# Patient Record
Sex: Male | Born: 1950
Health system: Southern US, Community
[De-identification: ages and names within clinical notes are randomized; demographics above are authoritative.]

## PROBLEM LIST (undated history)

## (undated) DIAGNOSIS — Z789 Other specified health status: Secondary | ICD-10-CM

## (undated) DIAGNOSIS — I609 Nontraumatic subarachnoid hemorrhage, unspecified: Secondary | ICD-10-CM

## (undated) DIAGNOSIS — J939 Pneumothorax, unspecified: Secondary | ICD-10-CM

## (undated) DIAGNOSIS — R296 Repeated falls: Secondary | ICD-10-CM

## (undated) HISTORY — DX: Nontraumatic subarachnoid hemorrhage, unspecified: I60.9

## (undated) HISTORY — DX: Pneumothorax, unspecified: J93.9

## (undated) HISTORY — PX: KNEE ARTHROSCOPY: SUR90

## (undated) HISTORY — DX: Repeated falls: R29.6

---

## 2011-08-19 DIAGNOSIS — J939 Pneumothorax, unspecified: Secondary | ICD-10-CM

## 2011-08-19 DIAGNOSIS — I609 Nontraumatic subarachnoid hemorrhage, unspecified: Secondary | ICD-10-CM

## 2011-08-19 HISTORY — DX: Nontraumatic subarachnoid hemorrhage, unspecified: I60.9

## 2011-08-19 HISTORY — DX: Pneumothorax, unspecified: J93.9

## 2011-10-21 ENCOUNTER — Emergency Department (HOSPITAL_BASED_OUTPATIENT_CLINIC_OR_DEPARTMENT_OTHER)
Admission: EM | Admit: 2011-10-21 | Discharge: 2011-10-21 | Discharge status: Against Medical Advice | Payer: BC Managed Care – PPO | Attending: Emergency Medicine | Admitting: Emergency Medicine

## 2011-10-21 ENCOUNTER — Encounter (HOSPITAL_BASED_OUTPATIENT_CLINIC_OR_DEPARTMENT_OTHER): Payer: Self-pay | Admitting: *Deleted

## 2011-10-21 DIAGNOSIS — R29810 Facial weakness: Secondary | ICD-10-CM | POA: Insufficient documentation

## 2011-10-21 DIAGNOSIS — F17200 Nicotine dependence, unspecified, uncomplicated: Secondary | ICD-10-CM | POA: Insufficient documentation

## 2011-10-21 DIAGNOSIS — W108XXA Fall (on) (from) other stairs and steps, initial encounter: Secondary | ICD-10-CM | POA: Insufficient documentation

## 2011-10-21 DIAGNOSIS — H748X9 Other specified disorders of middle ear and mastoid, unspecified ear: Secondary | ICD-10-CM | POA: Insufficient documentation

## 2011-10-21 DIAGNOSIS — Z9660 Presence of unspecified orthopedic joint implant: Secondary | ICD-10-CM | POA: Insufficient documentation

## 2011-10-21 DIAGNOSIS — S0990XA Unspecified injury of head, initial encounter: Secondary | ICD-10-CM

## 2011-10-21 DIAGNOSIS — G510 Bell's palsy: Secondary | ICD-10-CM

## 2011-10-21 DIAGNOSIS — Z880 Allergy status to penicillin: Secondary | ICD-10-CM | POA: Insufficient documentation

## 2011-10-21 DIAGNOSIS — S0450XA Injury of facial nerve, unspecified side, initial encounter: Secondary | ICD-10-CM | POA: Insufficient documentation

## 2011-10-21 NOTE — ED Provider Notes (Addendum)
History     CSN: [id number]  Arrival date & time 10/21/11  1654   First MD Initiated Contact with Patient 10/21/11 1706      Chief Complaint  Patient presents with  . Fall    (Consider location/radiation/quality/duration/timing/severity/associated sxs/prior treatment) HPI Comments: Patient states the ems came out after the fall but he refused care.  He tells me that since the injury occurred he has had decreased hearing out of his left ear, water runs out of the left side of his mouth when he drinks and his wife has told him that the left side of his face is drooping.  He denies headache or visual disturbance.  Patient is a 61 y.o. male presenting with fall. The history is provided by the patient.  Fall Incident onset: 12 days ago. The fall occurred while walking. Distance fallen: down steps and hit head on concrete floor. He landed on concrete. The volume of blood lost was moderate. The point of impact was the head. The pain is present in the head.    History reviewed. No pertinent past medical history.  Past Surgical History  Procedure Date  . Joint replacement     History reviewed. No pertinent family history.  History  Substance Use Topics  . Smoking status: Current Everyday Smoker  . Smokeless tobacco: Not on file  . Alcohol Use: Yes      Review of Systems  All other systems reviewed and are negative.    Allergies  Penicillins  Home Medications   Current Outpatient Rx  Name Route Sig Dispense Refill  . NAPROXEN SODIUM 220 MG PO TABS Oral Take 220 mg by mouth 2 (two) times daily as needed. For pain      BP 127/83  Pulse 104  Temp(Src) 98.4 F (36.9 C) (Oral)  Resp 16  Ht 5\' 8"  (1.727 m)  Wt 150 lb (68.04 kg)  BMI 22.81 kg/m2  SpO2 100%  Physical Exam  Nursing note and vitals reviewed. Constitutional: He is oriented to person, place, and time. He appears well-developed and well-nourished. No distress.  HENT:  Head: Normocephalic and atraumatic.    Right Ear: External ear normal.       The left ear canal has old, dried blood.  There also appears to be a hemotympanum on the left as well.    Eyes: EOM are normal. Pupils are equal, round, and reactive to light.       The left eye lid does not close unless he blinks forcefully.    Neck: Normal range of motion. Neck supple.  Musculoskeletal: Normal range of motion. He exhibits no edema.  Neurological: He is alert and oriented to person, place, and time. A cranial nerve deficit is present. Coordination normal.       There is a left facial droop on the left side along with an inability to completely close the eye without effort.  It appears as though the entire left 7th cranial nerve is paralyzed.    The strength is equal and symmetrical bilaterally.     Skin: Skin is warm and dry. He is not diaphoretic.    ED Course  Procedures (including critical care time)  Labs Reviewed - No data to display No results found.   No diagnosis found.    MDM  The patient presented 12 days after a significant head trauma.  He refused ems transport at that time and only comes in today because his wife made him.  He has what appears  to be a paralysis of the left facial nerve along with old blood in the ear canal.  I strongly suspect that he sustained a skull fracture that has compromised the left facial nerve, and may quite possibly have an intracranial hemorrhage.  The patient refuses to allow me to perform a CT scan due to monetary reasons.  I attempted to impress upon the importance of performing this study, however he wants to go home.  I have explained to him the risks of leaving without further studies, including disability and death.  He accepts these risks and is leaving AMA.  He tells me he will check with his family when he gets home about how to finance this test and may return once he works this out.  In my opinion, he has the decision making capacity to take responsibility for his  actions.       Geoffery Lyons, MD 10/21/11 1745  Geoffery Lyons, MD 10/21/11 [id number]

## 2011-10-21 NOTE — Discharge Instructions (Signed)
You may return to the ER at any time should you desire completion of your workup.

## 2011-10-21 NOTE — ED Notes (Signed)
Pt remains adamant that he is going home and refusing any further examination, testing, or treatment. Explained to patient it is Against Medical Advice that he leave prior to having the tests mentioned. Explained to pt that the risks of leaving ama are worsening of condition and possibility of death. Pt verbalizes understanding stating " I been living the past 12 days maybe I will live another few hours till I can make up my mind" Pt also verbalizes understanding that he is welcome to return to this ED or any other emergency department should his symptoms worsen or should he decide he wants be seen . Pt signed the AMA agreement after discussing risks with Dr Judd Lien and Antony Odea RN.

## 2011-10-21 NOTE — ED Notes (Signed)
Pt c/o fall x 12 days ago, pain to job cont

## 2011-10-21 NOTE — ED Notes (Signed)
Dr Judd Lien in room to examine pt explains to him that he needs to have a ct scan of his head due to the symptoms he has now and reportedly has had since a fall he had 12 days ago Noted to have left eye dysfunction the patient blinks but eye stays open pt also states "when I smile the left side of my face doesn't move just right and it looks sideways and crooked and sometimes if Im just sitting there water will just pour out of the left side of my mouth" Dr Judd Lien explains to pt how serious his previous injury could be and recommends a CT scan to determine the extent of his injuries . Pt states he doesn't think he wants to do that he just  Came to see why his jaw was still hurting. MD explained to this pt that he could have nerve damage that is causing his symptoms and pt states "Well I am just going to have to go home tonight and think about it and will come back tomorrow if my daddy and some others think that's a good idea" This RN in to speak with pt explaining that a CT scan will be beneficial to him to determine why his jaw is still hurting as well as to be sure he hasnt broken any bones in his face or skull. Pt tells me the same thing states " I am going to go home and think it over and talk it over with people. I will just come on back tomorrow if I change my mind"

## 2011-10-27 ENCOUNTER — Emergency Department (HOSPITAL_BASED_OUTPATIENT_CLINIC_OR_DEPARTMENT_OTHER)
Admission: EM | Admit: 2011-10-27 | Discharge: 2011-10-27 | Disposition: A | Discharge status: Home / Self Care | Payer: BC Managed Care – PPO | Attending: Emergency Medicine | Admitting: Emergency Medicine

## 2011-10-27 ENCOUNTER — Encounter (HOSPITAL_BASED_OUTPATIENT_CLINIC_OR_DEPARTMENT_OTHER): Payer: Self-pay

## 2011-10-27 ENCOUNTER — Emergency Department (INDEPENDENT_AMBULATORY_CARE_PROVIDER_SITE_OTHER): Payer: BC Managed Care – PPO

## 2011-10-27 DIAGNOSIS — W1809XA Striking against other object with subsequent fall, initial encounter: Secondary | ICD-10-CM | POA: Insufficient documentation

## 2011-10-27 DIAGNOSIS — F17200 Nicotine dependence, unspecified, uncomplicated: Secondary | ICD-10-CM | POA: Insufficient documentation

## 2011-10-27 DIAGNOSIS — S06300A Unspecified focal traumatic brain injury without loss of consciousness, initial encounter: Secondary | ICD-10-CM | POA: Insufficient documentation

## 2011-10-27 DIAGNOSIS — S02109A Fracture of base of skull, unspecified side, initial encounter for closed fracture: Secondary | ICD-10-CM | POA: Insufficient documentation

## 2011-10-27 DIAGNOSIS — H9209 Otalgia, unspecified ear: Secondary | ICD-10-CM | POA: Insufficient documentation

## 2011-10-27 DIAGNOSIS — H9190 Unspecified hearing loss, unspecified ear: Secondary | ICD-10-CM | POA: Insufficient documentation

## 2011-10-27 DIAGNOSIS — W19XXXA Unspecified fall, initial encounter: Secondary | ICD-10-CM

## 2011-10-27 DIAGNOSIS — I609 Nontraumatic subarachnoid hemorrhage, unspecified: Secondary | ICD-10-CM

## 2011-10-27 DIAGNOSIS — R29810 Facial weakness: Secondary | ICD-10-CM | POA: Insufficient documentation

## 2011-10-27 DIAGNOSIS — G510 Bell's palsy: Secondary | ICD-10-CM

## 2011-10-27 DIAGNOSIS — R51 Headache: Secondary | ICD-10-CM | POA: Insufficient documentation

## 2011-10-27 DIAGNOSIS — H7290 Unspecified perforation of tympanic membrane, unspecified ear: Secondary | ICD-10-CM

## 2011-10-27 DIAGNOSIS — S0291XA Unspecified fracture of skull, initial encounter for closed fracture: Secondary | ICD-10-CM

## 2011-10-27 MED ORDER — TEARS RENEWED OP SOLN: 1.0000 [drp] | Freq: Four times a day (QID) | OPHTHALMIC | Status: DC | PRN

## 2011-10-27 MED ORDER — LACRI-LUBE S.O.P. OP OINT: 1.0000 [in_us] | TOPICAL_OINTMENT | Freq: Four times a day (QID) | OPHTHALMIC | Status: DC | PRN

## 2011-10-27 MED ORDER — PREDNISONE 50 MG PO TABS: ORAL_TABLET | ORAL | Status: AC

## 2011-10-27 NOTE — Discharge Instructions (Signed)
Bell's Palsy  Bell's palsy is a condition in which one side of the face becomes temporarily weak or paralyzed. Most of the time no cause is found. A viral infection of the facial nerve is the most commonly suspected cause. The condition almost always clears up in a few weeks to months. However, in a small number of people, the weakness can be permanent. Sometimes steroid medicines and antiviral medicines are prescribed to improve recovery time. Blood and other tests are usually not needed, but they may be performed at your caregiver's discretion, to rule out other causes.  Careful follow up is importantto be sure the facial nerve is recovering. Because facial weakness can make it hard to blink, it is important to prevent drying of the eye. Artificial tears are often prescribed to keep the eye lubricated. Glasses or an eye patch should be worn to protect the eye, if you cannot close your eye completely. If the eye is not protected, permanent damage can be done to the cornea (clear covering over your eye). Sometimes facial massage and exercises help weakened muscles recover.   SEEK IMMEDIATE MEDICAL CARE IF:    You have increased weakness, earache, headache, or dizziness.   You develop new problems or symptoms, or the area of weakness or paralysis extends beyond the face.   You feel you are getting worse.  Document Released: 09/11/2004 Document Revised: 07/24/2011 Document Reviewed: 08/13/2009  [employer] Patient Information 2012 [employer]

## 2011-10-27 NOTE — ED Notes (Signed)
Pt states he fell in his garage 18 days ago in his garage, seen here last week, refused ct and signed out ama for financial reasons. Pt states he cont with hearing loss to left ear, pt states he had bleeding from this ear when he fell, but none since. Pt is alert and oriented, denies ha or visual changes.

## 2011-10-27 NOTE — ED Provider Notes (Signed)
This chart was scribed for Raymond Gaskins, MD by Wallis Mart. The patient was seen in room MH01/MH01 and the patient's care was started at 4:35 PM.   CSN: [id number]  Arrival date & time 10/27/11  1457   First MD Initiated Contact with Patient 10/27/11 1607      Chief Complaint  Patient presents with  . Head Injury     Patient is a 61 y.o. male presenting with head injury.  Head Injury  The incident occurred more than 1 week ago. He came to the ER via walk-in. The injury mechanism was a fall. The volume of blood lost was minimal. The quality of the pain is described as dull. The pain is mild. The pain has been improving since the injury. Pertinent negatives include no vomiting, no disorientation, no weakness and no memory loss. He has tried NSAIDs for the symptoms. The treatment provided significant relief.     Raymond Quinn is a 61 y.o. male who presents to the Emergency Department complaining of head injury. Pt states that he fell down in the garage and hit his head on concrete over 2 weeks ago. Pt wasn't sure if he fell or fainted. Daughter found him lying on the floor with blood coming out of his left ear. Pt saw Dr. Judd Lien after the fall and refused CT due to financial reasons, Dr Judd Lien noted that he may have injured 7th cranial nerve because his left eye was blinking less than the right . Per family, pt has been more tired than usual,  noted facial asymmetry following injury. Pt feels that his sx are improving but he can still only "hear 50%" out of his left ear and still experiences left ear pain, neck pain, (starting at 4 AM each night), headaches. Pt denies any current headaches, denies vision changes, difficulty swallowing or speaking.  Pt has been taking ibuprofen with relief of pain.  Pt denies any known medical problems.  Pt denies feeling faint, dizziness, lightheadedness. There are no other associated symptoms and no other alleviating or aggravating factors. Pt states that  he had a skull fracture while doing karate several years ago.   Pt admits to drinking during the day on a frequent basis  PMH - none  Past Surgical History  Procedure Date  . Knee arthroscopy     No family history on file.  History  Substance Use Topics  . Smoking status: Current Everyday Smoker    Types: Cigars  . Smokeless tobacco: Not on file  . Alcohol Use: Yes      Review of Systems  Constitutional: Negative for fever and chills.  HENT: Positive for hearing loss and ear pain. Negative for rhinorrhea and neck pain.   Eyes: Negative for pain.  Respiratory: Negative for cough and shortness of breath.   Cardiovascular: Negative for chest pain.  Gastrointestinal: Negative for nausea, vomiting, abdominal pain and diarrhea.  Genitourinary: Negative for dysuria.  Musculoskeletal: Negative for back pain.  Skin: Negative for rash.  Neurological: Negative for dizziness and weakness.  Psychiatric/Behavioral: Negative for memory loss.  All other systems reviewed and are negative.    Allergies  Penicillins  Home Medications   Current Outpatient Rx  Name Route Sig Dispense Refill  . IBUPROFEN IB PO Oral Take 1 tablet by mouth daily as needed. Patient used this medication for his ear pain.    Marland Kitchen NAPROXEN SODIUM 220 MG PO TABS Oral Take 220 mg by mouth 2 (two) times daily as needed.  For pain      BP 131/87  Pulse 96  Temp(Src) 98.3 F (36.8 C) (Oral)  Ht 5\' 9"  (1.753 m)  Wt 152 lb (68.947 kg)  BMI 22.45 kg/m2  SpO2 98%  Physical Exam CONSTITUTIONAL: Well developed/well nourished HEAD AND FACE: Normocephalic/atraumatic EYES: EOMI/PERRL ENMT: Mucous membranes moist.  Left ear - dried blood in canal, unable to visualize TM.  No tenderness/bruising at either mastoid, ears symmetric NECK: supple no meningeal signs SPINE:entire spine nontender, No bruising/crepitance/stepoffs noted to spine CV: S1/S2 noted, no murmurs/rubs/gallops noted LUNGS: Lungs are clear to  auscultation bilaterally, no apparent distress ABDOMEN: soft, nontender, no rebound or guarding GU:no cva tenderness NEURO: Awake/alert, no arm or leg drift is noted Cranial nerves 3/4/5/6//04/27/10/12 tested and intact Left facial droop noted He has weakness noted to raising left eyebrow and unable to fully close left eye No facial sensory deficits Gait normal No past pointing EXTREMITIES: pulses normal, full ROM SKIN: warm, color normal PSYCH: no abnormalities of mood noted  ED Course  Procedures  DIAGNOSTIC STUDIES: Oxygen Saturation is 98% on room air, normal by my interpretation.    COORDINATION OF CARE: 4:56 PM Pt with what appears to be bells palsy, but s/p head injury several weeks ago.  He is feeling improved, but will have some headache.  Reports his facial weakness is improved but still present.  He thinks it did not start until after his fall (facial weakness)  No other neuro deficits noted.  I did advise CT head.  5:47 PM Discussed CT findings with nsgy dr cram He reviewed images He does not feel he needs any emergent workup/admission He can f/u as outpatient with ENT He can f/u as outpatient with neurology for facial palsy He recommended steroids for his facial nerve palsy Suspect he has healed left TM rupture, advised ENT f/u and to avoid getting ear wet I discussed findings with patient/family I advised to cut back on his ETOH use We discussed eye care instructions for facial nerve palsy   MDM  Nursing notes reviewed and considered in documentation Previous records reviewed and considered    I personally performed the services described in this documentation, which was scribed in my presence. The recorded information has been reviewed and considered.      Raymond Gaskins, MD 10/27/11 1759

## 2011-10-27 NOTE — ED Notes (Signed)
Head injury 3 weeks ago-was seen in ED-refused CT due to financial reasons-left AMA-current c/o cont'd pain and decreased hearing to left ear-other c/o resolved per pt

## 2012-03-30 ENCOUNTER — Inpatient Hospital Stay (HOSPITAL_COMMUNITY)
Admission: EM | Admit: 2012-03-30 | Discharge: 2012-04-05 | DRG: 733 | Disposition: A | Discharge status: Home Health | Payer: BC Managed Care – PPO | Attending: General Surgery | Admitting: General Surgery

## 2012-03-30 ENCOUNTER — Emergency Department (HOSPITAL_COMMUNITY): Payer: BC Managed Care – PPO

## 2012-03-30 ENCOUNTER — Inpatient Hospital Stay (HOSPITAL_COMMUNITY): Payer: BC Managed Care – PPO

## 2012-03-30 ENCOUNTER — Encounter (HOSPITAL_COMMUNITY): Payer: Self-pay | Admitting: *Deleted

## 2012-03-30 DIAGNOSIS — S2242XA Multiple fractures of ribs, left side, initial encounter for closed fracture: Secondary | ICD-10-CM

## 2012-03-30 DIAGNOSIS — S272XXA Traumatic hemopneumothorax, initial encounter: Secondary | ICD-10-CM | POA: Diagnosis present

## 2012-03-30 DIAGNOSIS — S2249XA Multiple fractures of ribs, unspecified side, initial encounter for closed fracture: Secondary | ICD-10-CM | POA: Diagnosis present

## 2012-03-30 DIAGNOSIS — J9819 Other pulmonary collapse: Secondary | ICD-10-CM | POA: Diagnosis present

## 2012-03-30 DIAGNOSIS — I609 Nontraumatic subarachnoid hemorrhage, unspecified: Secondary | ICD-10-CM

## 2012-03-30 DIAGNOSIS — S069X9A Unspecified intracranial injury with loss of consciousness of unspecified duration, initial encounter: Secondary | ICD-10-CM

## 2012-03-30 DIAGNOSIS — S42009A Fracture of unspecified part of unspecified clavicle, initial encounter for closed fracture: Secondary | ICD-10-CM | POA: Diagnosis present

## 2012-03-30 DIAGNOSIS — S270XXA Traumatic pneumothorax, initial encounter: Secondary | ICD-10-CM

## 2012-03-30 DIAGNOSIS — S066X9A Traumatic subarachnoid hemorrhage with loss of consciousness of unspecified duration, initial encounter: Secondary | ICD-10-CM

## 2012-03-30 DIAGNOSIS — J939 Pneumothorax, unspecified: Secondary | ICD-10-CM

## 2012-03-30 DIAGNOSIS — S2239XA Fracture of one rib, unspecified side, initial encounter for closed fracture: Secondary | ICD-10-CM

## 2012-03-30 DIAGNOSIS — W108XXA Fall (on) (from) other stairs and steps, initial encounter: Secondary | ICD-10-CM | POA: Diagnosis present

## 2012-03-30 DIAGNOSIS — S42002A Fracture of unspecified part of left clavicle, initial encounter for closed fracture: Secondary | ICD-10-CM

## 2012-03-30 DIAGNOSIS — F17200 Nicotine dependence, unspecified, uncomplicated: Secondary | ICD-10-CM | POA: Diagnosis present

## 2012-03-30 DIAGNOSIS — H748X9 Other specified disorders of middle ear and mastoid, unspecified ear: Secondary | ICD-10-CM | POA: Diagnosis present

## 2012-03-30 DIAGNOSIS — S0219XA Other fracture of base of skull, initial encounter for closed fracture: Secondary | ICD-10-CM

## 2012-03-30 DIAGNOSIS — F1010 Alcohol abuse, uncomplicated: Secondary | ICD-10-CM

## 2012-03-30 DIAGNOSIS — S02109A Fracture of base of skull, unspecified side, initial encounter for closed fracture: Principal | ICD-10-CM | POA: Diagnosis present

## 2012-03-30 DIAGNOSIS — S065XAA Traumatic subdural hemorrhage with loss of consciousness status unknown, initial encounter: Principal | ICD-10-CM | POA: Diagnosis present

## 2012-03-30 DIAGNOSIS — Y92009 Unspecified place in unspecified non-institutional (private) residence as the place of occurrence of the external cause: Secondary | ICD-10-CM

## 2012-03-30 HISTORY — DX: Other specified health status: Z78.9

## 2012-03-30 LAB — CBC WITH DIFFERENTIAL/PLATELET
Basophils Absolute: 0.1 10*3/uL (ref 0.0–0.1)
Basophils Relative: 1 % (ref 0–1)
Eosinophils Absolute: 0.1 10*3/uL (ref 0.0–0.7)
Eosinophils Relative: 1 % (ref 0–5)
HCT: 45.7 % (ref 39.0–52.0)
Hemoglobin: 16 g/dL (ref 13.0–17.0)
Lymphocytes Relative: 30 % (ref 12–46)
Lymphs Abs: 3.7 10*3/uL (ref 0.7–4.0)
MCH: 36.6 pg — ABNORMAL HIGH (ref 26.0–34.0)
MCHC: 35 g/dL (ref 30.0–36.0)
MCV: 104.6 fL — ABNORMAL HIGH (ref 78.0–100.0)
Monocytes Absolute: 1 10*3/uL (ref 0.1–1.0)
Monocytes Relative: 8 % (ref 3–12)
Neutro Abs: 7.4 10*3/uL (ref 1.7–7.7)
Neutrophils Relative %: 60 % (ref 43–77)
Platelets: 129 10*3/uL — ABNORMAL LOW (ref 150–400)
RBC: 4.37 MIL/uL (ref 4.22–5.81)
RDW: 13.3 % (ref 11.5–15.5)
WBC: 12.3 10*3/uL — ABNORMAL HIGH (ref 4.0–10.5)

## 2012-03-30 LAB — HEPATIC FUNCTION PANEL
ALT: 20 U/L (ref 0–53)
AST: 37 U/L (ref 0–37)
Albumin: 3.6 g/dL (ref 3.5–5.2)
Alkaline Phosphatase: 50 U/L (ref 39–117)
Bilirubin, Direct: 0.2 mg/dL (ref 0.0–0.3)
Indirect Bilirubin: 0.8 mg/dL (ref 0.3–0.9)
Total Bilirubin: 1 mg/dL (ref 0.3–1.2)
Total Protein: 6.6 g/dL (ref 6.0–8.3)

## 2012-03-30 LAB — URINALYSIS, MICROSCOPIC ONLY
Bilirubin Urine: NEGATIVE
Glucose, UA: NEGATIVE mg/dL
Ketones, ur: 15 mg/dL — AB
Leukocytes, UA: NEGATIVE
Nitrite: NEGATIVE
Protein, ur: 30 mg/dL — AB
Specific Gravity, Urine: 1.024 (ref 1.005–1.030)
Urobilinogen, UA: 1 mg/dL (ref 0.0–1.0)
pH: 6.5 (ref 5.0–8.0)

## 2012-03-30 LAB — APTT: aPTT: 29 seconds (ref 24–37)

## 2012-03-30 LAB — POCT I-STAT, CHEM 8
BUN: <3 mg/dL — ABNORMAL LOW (ref 6–23)
Calcium, Ion: 1.08 mmol/L — ABNORMAL LOW (ref 1.13–1.30)
Chloride: 104 mEq/L (ref 96–112)
Creatinine, Ser: 1.1 mg/dL (ref 0.50–1.35)
Glucose, Bld: 126 mg/dL — ABNORMAL HIGH (ref 70–99)
HCT: 49 % (ref 39.0–52.0)
Hemoglobin: 16.7 g/dL (ref 13.0–17.0)
Potassium: 3.4 mEq/L — ABNORMAL LOW (ref 3.5–5.1)
Sodium: 140 mEq/L (ref 135–145)
TCO2: 22 mmol/L (ref 0–100)

## 2012-03-30 LAB — SURGICAL PCR SCREEN
MRSA, PCR: NEGATIVE
Staphylococcus aureus: NEGATIVE

## 2012-03-30 LAB — PROTIME-INR
INR: 1.03 (ref 0.00–1.49)
Prothrombin Time: 13.7 seconds (ref 11.6–15.2)

## 2012-03-30 LAB — AMMONIA: Ammonia: 27 umol/L (ref 11–60)

## 2012-03-30 LAB — ETHANOL: Alcohol, Ethyl (B): 228 mg/dL — ABNORMAL HIGH (ref 0–11)

## 2012-03-30 MED ORDER — FENTANYL CITRATE 0.05 MG/ML IJ SOLN
INTRAMUSCULAR | Status: AC
  Filled 2012-03-30: qty 2

## 2012-03-30 MED ORDER — ONDANSETRON HCL 4 MG PO TABS
4.0000 mg | ORAL_TABLET | Freq: Four times a day (QID) | ORAL | Status: DC | PRN
  Administered 2012-04-03: 4 mg via ORAL
  Filled 2012-03-30: qty 1

## 2012-03-30 MED ORDER — ONDANSETRON HCL 4 MG/2ML IJ SOLN
4.0000 mg | Freq: Four times a day (QID) | INTRAMUSCULAR | Status: DC | PRN
  Administered 2012-03-30 – 2012-03-31 (×3): 4 mg via INTRAVENOUS
  Filled 2012-03-30 (×4): qty 2

## 2012-03-30 MED ORDER — FOLIC ACID 1 MG PO TABS
1.0000 mg | ORAL_TABLET | Freq: Every day | ORAL | Status: DC
  Administered 2012-03-31 – 2012-04-05 (×6): 1 mg via ORAL
  Filled 2012-03-30 (×7): qty 1

## 2012-03-30 MED ORDER — HYDROMORPHONE HCL PF 1 MG/ML IJ SOLN
1.0000 mg | INTRAMUSCULAR | Status: DC | PRN
  Administered 2012-03-30 – 2012-03-31 (×3): 1 mg via INTRAVENOUS
  Filled 2012-03-30 (×4): qty 1

## 2012-03-30 MED ORDER — MIDAZOLAM HCL 2 MG/2ML IJ SOLN
INTRAMUSCULAR | Status: AC
  Filled 2012-03-30: qty 2

## 2012-03-30 MED ORDER — HYDROMORPHONE HCL PF 1 MG/ML IJ SOLN: 0.5000 mg | INTRAMUSCULAR | Status: DC | PRN

## 2012-03-30 MED ORDER — FENTANYL CITRATE 0.05 MG/ML IJ SOLN
50.0000 ug | Freq: Once | INTRAMUSCULAR | Status: AC
  Administered 2012-03-30: 50 ug via INTRAVENOUS

## 2012-03-30 MED ORDER — LORAZEPAM 1 MG PO TABS: 1.0000 mg | ORAL_TABLET | Freq: Four times a day (QID) | ORAL | Status: AC | PRN

## 2012-03-30 MED ORDER — SODIUM CHLORIDE 0.9 % IV BOLUS (SEPSIS)
1000.0000 mL | Freq: Once | INTRAVENOUS | Status: AC
  Administered 2012-03-30: 1000 mL via INTRAVENOUS

## 2012-03-30 MED ORDER — LORAZEPAM 2 MG/ML IJ SOLN
1.0000 mg | Freq: Four times a day (QID) | INTRAMUSCULAR | Status: AC | PRN
  Filled 2012-03-30: qty 1

## 2012-03-30 MED ORDER — THIAMINE HCL 100 MG/ML IJ SOLN
100.0000 mg | Freq: Every day | INTRAMUSCULAR | Status: DC
  Filled 2012-03-30 (×3): qty 1

## 2012-03-30 MED ORDER — LORAZEPAM 2 MG/ML IJ SOLN: 0.0000 mg | Freq: Two times a day (BID) | INTRAMUSCULAR | Status: AC

## 2012-03-30 MED ORDER — PANTOPRAZOLE SODIUM 40 MG PO TBEC
40.0000 mg | DELAYED_RELEASE_TABLET | Freq: Every day | ORAL | Status: DC
  Administered 2012-03-31: 40 mg via ORAL
  Filled 2012-03-30: qty 1

## 2012-03-30 MED ORDER — NICOTINE 21 MG/24HR TD PT24
21.0000 mg | MEDICATED_PATCH | Freq: Every day | TRANSDERMAL | Status: DC
  Administered 2012-03-31 – 2012-04-01 (×2): 21 mg via TRANSDERMAL
  Filled 2012-03-30 (×3): qty 1

## 2012-03-30 MED ORDER — FENTANYL CITRATE 0.05 MG/ML IJ SOLN
INTRAMUSCULAR | Status: AC | PRN
  Administered 2012-03-30: 50 ug via INTRAVENOUS

## 2012-03-30 MED ORDER — ADULT MULTIVITAMIN W/MINERALS CH
1.0000 | ORAL_TABLET | Freq: Every day | ORAL | Status: DC
  Administered 2012-03-31 – 2012-04-05 (×6): 1 via ORAL
  Filled 2012-03-30 (×7): qty 1

## 2012-03-30 MED ORDER — LORAZEPAM 2 MG/ML IJ SOLN
0.0000 mg | Freq: Four times a day (QID) | INTRAMUSCULAR | Status: AC
  Administered 2012-03-31: 2 mg via INTRAVENOUS
  Filled 2012-03-30: qty 1

## 2012-03-30 MED ORDER — HYDROMORPHONE HCL PF 1 MG/ML IJ SOLN
1.0000 mg | INTRAMUSCULAR | Status: DC | PRN
  Administered 2012-03-30 – 2012-03-31 (×2): 1 mg via INTRAVENOUS
  Filled 2012-03-30: qty 1

## 2012-03-30 MED ORDER — METHOCARBAMOL 100 MG/ML IJ SOLN
500.0000 mg | Freq: Four times a day (QID) | INTRAVENOUS | Status: DC | PRN
  Filled 2012-03-30: qty 5

## 2012-03-30 MED ORDER — FENTANYL CITRATE 0.05 MG/ML IJ SOLN
INTRAMUSCULAR | Status: AC
  Administered 2012-03-30: 50 ug via INTRAVENOUS
  Filled 2012-03-30: qty 2

## 2012-03-30 MED ORDER — POTASSIUM CHLORIDE IN NACL 20-0.9 MEQ/L-% IV SOLN
INTRAVENOUS | Status: DC
  Administered 2012-03-30 – 2012-04-01 (×2): via INTRAVENOUS
  Filled 2012-03-30 (×7): qty 1000

## 2012-03-30 MED ORDER — MIDAZOLAM HCL 2 MG/2ML IJ SOLN
2.0000 mg | Freq: Once | INTRAMUSCULAR | Status: AC
  Administered 2012-03-30: 16:00:00 via INTRAVENOUS

## 2012-03-30 MED ORDER — VITAMIN B-1 100 MG PO TABS
100.0000 mg | ORAL_TABLET | Freq: Every day | ORAL | Status: DC
  Administered 2012-03-31 – 2012-04-05 (×6): 100 mg via ORAL
  Filled 2012-03-30 (×7): qty 1

## 2012-03-30 MED ORDER — PANTOPRAZOLE SODIUM 40 MG IV SOLR
40.0000 mg | Freq: Every day | INTRAVENOUS | Status: DC
  Filled 2012-03-30 (×2): qty 40

## 2012-03-30 MED ORDER — IOHEXOL 300 MG/ML  SOLN
100.0000 mL | Freq: Once | INTRAMUSCULAR | Status: AC | PRN
  Administered 2012-03-30: 100 mL via INTRAVENOUS

## 2012-03-30 NOTE — ED Notes (Signed)
Reassessment completed prior to pt going to CT

## 2012-03-30 NOTE — ED Notes (Signed)
EMS-pt lives with son and was found at the bottom of the stairs unresponsive, pt with blood to left side of head and on the ground, son unable to find pulse or awake pt, CPR inititaed. On EMS arrival pt with agonal respirations but pulses noted, NPA placed and pt bagged, pt woke up after approx 1 min of bagging. IV established. Pt would not follow commands and stay still for EMS. Pt appears to be intoxicated at this time, pt with hx of alcoholism.

## 2012-03-30 NOTE — Consult Note (Signed)
Reason for Consult:[facility] Referring Physician: ED MD  Raymond Quinn is an 61 y.o. male.  HPI: the patient is a 61 yo male with etoh abuse who fell this morning striking his head. He came to [facility] with blood alcohol level of greater than 200 and was noted to have a traumatic sub arachnoid hemorrhage on his CT head. Neurosurgical consult was requested.   History reviewed. No pertinent past medical history.  Past Surgical History  Procedure Date  . Knee arthroscopy     History reviewed. No pertinent family history.  Social History:  reports that he has been smoking Cigars.  He does not have any smokeless tobacco history on file. He reports that he drinks alcohol. He reports that he does not use illicit drugs.  Allergies:  Allergies  Allergen Reactions  . Penicillins Swelling    Medications: I have reviewed the patient's current medications.  Results for orders placed during the hospital encounter of 03/30/12 (from the past 48 hour(s))  CBC WITH DIFFERENTIAL     Status: Abnormal   Collection Time   03/30/12 11:56 AM      Component Value Range Comment   WBC 12.3 (*) 4.0 - 10.5 K/uL    RBC 4.37  4.22 - 5.81 MIL/uL    Hemoglobin 16.0  13.0 - 17.0 g/dL    HCT 16.1  09.6 - 04.5 %    MCV 104.6 (*) 78.0 - 100.0 fL    MCH 36.6 (*) 26.0 - 34.0 pg    MCHC 35.0  30.0 - 36.0 g/dL    RDW 40.9  81.1 - 91.4 %    Platelets 129 (*) 150 - 400 K/uL    Neutrophils Relative 60  43 - 77 %    Lymphocytes Relative 30  12 - 46 %    Monocytes Relative 8  3 - 12 %    Eosinophils Relative 1  0 - 5 %    Basophils Relative 1  0 - 1 %    Neutro Abs 7.4  1.7 - 7.7 K/uL    Lymphs Abs 3.7  0.7 - 4.0 K/uL    Monocytes Absolute 1.0  0.1 - 1.0 K/uL    Eosinophils Absolute 0.1  0.0 - 0.7 K/uL    Basophils Absolute 0.1  0.0 - 0.1 K/uL    Smear Review MORPHOLOGY UNREMARKABLE     HEPATIC FUNCTION PANEL     Status: Normal   Collection Time   03/30/12 11:56 AM      Component Value Range Comment   Total Protein 6.6  6.0 - 8.3 g/dL    Albumin 3.6  3.5 - 5.2 g/dL    AST 37  0 - 37 U/L    ALT 20  0 - 53 U/L    Alkaline Phosphatase 50  39 - 117 U/L    Total Bilirubin 1.0  0.3 - 1.2 mg/dL    Bilirubin, Direct 0.2  0.0 - 0.3 mg/dL    Indirect Bilirubin 0.8  0.3 - 0.9 mg/dL   PROTIME-INR     Status: Normal   Collection Time   03/30/12 11:56 AM      Component Value Range Comment   Prothrombin Time 13.7  11.6 - 15.2 seconds    INR 1.03  0.00 - 1.49   APTT     Status: Normal   Collection Time   03/30/12 11:56 AM      Component Value Range Comment   aPTT 29  24 -  37 seconds   AMMONIA     Status: Normal   Collection Time   03/30/12 11:56 AM      Component Value Range Comment   Ammonia 27  11 - 60 umol/L   POCT I-STAT, CHEM 8     Status: Abnormal   Collection Time   03/30/12 12:09 PM      Component Value Range Comment   Sodium 140  135 - 145 mEq/L    Potassium 3.4 (*) 3.5 - 5.1 mEq/L    Chloride 104  96 - 112 mEq/L    BUN <3 (*) 6 - 23 mg/dL    Creatinine, Ser 1.61  0.50 - 1.35 mg/dL    Glucose, Bld 096 (*) 70 - 99 mg/dL    Calcium, Ion 0.45 (*) 1.13 - 1.30 mmol/L    TCO2 22  0 - 100 mmol/L    Hemoglobin 16.7  13.0 - 17.0 g/dL    HCT 40.9  81.1 - 91.4 %   ETHANOL     Status: Abnormal   Collection Time   03/30/12 12:32 PM      Component Value Range Comment   Alcohol, Ethyl (B) 228 (*) 0 - 11 mg/dL     Dg Chest 2 View  7/82/9562  *RADIOLOGY REPORT*  Clinical Data: Status post fall.  Pain.  CHEST - 2 VIEW  Comparison: None.  Findings: The patient has a small left pneumothorax estimated at 10- 15%.  There is some left basilar atelectasis.  The right lung is clear.  No pleural effusion.  Heart size is normal.  There are displaced fractures of the left fourth, fifth and sixth ribs.  Left clavicle fracture appears nondisplaced. Heart size is normal.  IMPRESSION: Small left pneumothorax due to displaced fractures of the left fourth through sixth ribs. Left clavicle fracture is also seen.   Critical Value/emergent results were called by telephone at the time of interpretation on 03/30/2012 at 1:45 p.m. to Dr. Rubin Payor, who verbally acknowledged these results.  Original Report Authenticated By: Bernadene Bell. Maricela Curet, M.D.   Dg Thoracic Spine 2 View  03/30/2012  *RADIOLOGY REPORT*  Clinical Data: Fall, pain.  THORACIC SPINE - 2 VIEW  Comparison: None.  Findings: Vertebral body height and alignment are maintained with mild convex right thoracic scoliosis noted.  The patient has left fourth through sixth rib fractures of the left clavicle fracture.  IMPRESSION: Left 4-6 rib fractures and left clavicle fracture.  Negative for spine fracture.  Original Report Authenticated By: Bernadene Bell. D'ALESSIO, M.D.   Dg Lumbar Spine 2-3 Views  03/30/2012  *RADIOLOGY REPORT*  Clinical Data: Status post fall.  LUMBAR SPINE - 2-3 VIEW  Comparison: None.  Findings: Vertebral body height and alignment are maintained. There is some anterior endplate spurring.  Facet arthropathy lower lumbar spine is noted.  Mild convex left scoliosis is seen.  IMPRESSION: No acute finding.  Original Report Authenticated By: Bernadene Bell. Maricela Curet, M.D.   Ct Head Wo Contrast  03/30/2012  *RADIOLOGY REPORT*  Clinical Data:  Fall, left scalp hematoma, initially unresponsive but improved. Left T attempt.  CT HEAD WITHOUT CONTRAST CT CERVICAL SPINE WITHOUT CONTRAST  Technique:  Multidetector CT imaging of the head and cervical spine was performed following the standard protocol without intravenous contrast.  Multiplanar CT image reconstructions of the cervical spine were also generated.  Comparison:  10/27/2011.  CT HEAD  Findings: Moderate sized left temporal and left parietal scalp hematoma.  There is a left temporal squama skull  fracture which was identified previously which appears slightly wider on today's exam suggesting reinjury.  There is evidence for pneumocephalus in the left middle cranial fossa (image 15), and air  around the left  temporalis muscle, and air within the carotid canal.  There is extensive blood  in the left external canal and left middle ear/mastoid suspicious for a  left temporal bone fracture.  CT temporal bone recommended for further evaluation.  Mild atrophy is present.  There is subarachnoid blood most prominently seen in the right sylvian fissure, as well as a small posterior frontal opercular subcortical 6 x 8 mm shearing injury. This may be associated with a small right posterior frontal subdural collection (image 20) in the right suprasylvian region. A lesser amount of subarachnoid blood is suspected in the left middle cranial fossa laterally.  Mild atrophy is present.  No areas of cortical infarction are seen. Mild chronic microvascular ischemic changes suspected in the deep white matter.  An air-fluid level is present in the right maxillary sinus.  This is incompletely evaluated, but in the setting of trauma could represent a right orbital blowout injury.  CT maxillofacial could provide additional information.  Air is seen in both cavernous sinuses which could be iatrogenic from intravenous catheter placement.  Mild mucosal thickening noted in the ethmoid sinuses. Frontal sinuses clear.  Mild mucosal thickening in the sphenoid.  IMPRESSION: Left temporal squama fracture appears similar to the injury in March, but slightly wider and may have been reinjured with the current fall.  There is a moderate-sized left temporal parietal scalp hematoma.  Bilateral right greater than left subarachnoid blood, most notably right sylvian fissure, with a small 6 x 8 mm shearing injury right posterior frontal operculum with a minimal extra-axial hematoma associated.  Air-fluid level right maxillary sinus could indicate a blowout injury.  Correlate clinically.  CT maxillofacial could be helpful in further evaluation.  Left middle cranial fossa pneumocephalus along with air in and around the carotid canal and left temporalis muscle  likely representing temporal bone fracture.  There is evidence for blood in the middle ear/mastoid and external canal on the left.  CT temporal bone could provide additional information.  CT CERVICAL SPINE  Findings: There is no visible cervical spine fracture or traumatic subluxation.  Disc space narrowing is noted at C5-6 and C6-7 with moderate uncinate spurring and osteophyte formation.  There is no prevertebral soft tissue swelling or intraspinal hematoma.  No neck masses are identified.  Suspect left pleural effusion but incompletely evaluated.  Trachea midline.  IMPRESSION: Cervical spondylosis as described.  No visible cervical spine fracture.  Suspect left pleural effusion, incompletely evaluated.  Critical Value/emergent results were called by telephone at the time of interpretation on 03/30/2012 at 1:20 p.m. to Dr. Rubin Payor, who verbally acknowledged these results.  Original Report Authenticated By: Elsie Stain, M.D.   Ct Cervical Spine Wo Contrast  03/30/2012  *RADIOLOGY REPORT*  Clinical Data:  Fall, left scalp hematoma, initially unresponsive but improved. Left T attempt.  CT HEAD WITHOUT CONTRAST CT CERVICAL SPINE WITHOUT CONTRAST  Technique:  Multidetector CT imaging of the head and cervical spine was performed following the standard protocol without intravenous contrast.  Multiplanar CT image reconstructions of the cervical spine were also generated.  Comparison:  10/27/2011.  CT HEAD  Findings: Moderate sized left temporal and left parietal scalp hematoma.  There is a left temporal squama skull fracture which was identified previously which appears slightly wider on today's exam suggesting reinjury.  There is evidence for pneumocephalus in the left middle cranial fossa (image 15), and air  around the left temporalis muscle, and air within the carotid canal.  There is extensive blood  in the left external canal and left middle ear/mastoid suspicious for a  left temporal bone fracture.  CT  temporal bone recommended for further evaluation.  Mild atrophy is present.  There is subarachnoid blood most prominently seen in the right sylvian fissure, as well as a small posterior frontal opercular subcortical 6 x 8 mm shearing injury. This may be associated with a small right posterior frontal subdural collection (image 20) in the right suprasylvian region. A lesser amount of subarachnoid blood is suspected in the left middle cranial fossa laterally.  Mild atrophy is present.  No areas of cortical infarction are seen. Mild chronic microvascular ischemic changes suspected in the deep white matter.  An air-fluid level is present in the right maxillary sinus.  This is incompletely evaluated, but in the setting of trauma could represent a right orbital blowout injury.  CT maxillofacial could provide additional information.  Air is seen in both cavernous sinuses which could be iatrogenic from intravenous catheter placement.  Mild mucosal thickening noted in the ethmoid sinuses. Frontal sinuses clear.  Mild mucosal thickening in the sphenoid.  IMPRESSION: Left temporal squama fracture appears similar to the injury in March, but slightly wider and may have been reinjured with the current fall.  There is a moderate-sized left temporal parietal scalp hematoma.  Bilateral right greater than left subarachnoid blood, most notably right sylvian fissure, with a small 6 x 8 mm shearing injury right posterior frontal operculum with a minimal extra-axial hematoma associated.  Air-fluid level right maxillary sinus could indicate a blowout injury.  Correlate clinically.  CT maxillofacial could be helpful in further evaluation.  Left middle cranial fossa pneumocephalus along with air in and around the carotid canal and left temporalis muscle likely representing temporal bone fracture.  There is evidence for blood in the middle ear/mastoid and external canal on the left.  CT temporal bone could provide additional information.  CT  CERVICAL SPINE  Findings: There is no visible cervical spine fracture or traumatic subluxation.  Disc space narrowing is noted at C5-6 and C6-7 with moderate uncinate spurring and osteophyte formation.  There is no prevertebral soft tissue swelling or intraspinal hematoma.  No neck masses are identified.  Suspect left pleural effusion but incompletely evaluated.  Trachea midline.  IMPRESSION: Cervical spondylosis as described.  No visible cervical spine fracture.  Suspect left pleural effusion, incompletely evaluated.  Critical Value/emergent results were called by telephone at the time of interpretation on 03/30/2012 at 1:20 p.m. to Dr. Rubin Payor, who verbally acknowledged these results.  Original Report Authenticated By: Elsie Stain, M.D.    Review of systems not obtained due to patient factors. Blood pressure 130/87, pulse 92, temperature 97.5 F (36.4 C), resp. rate 18, SpO2 95.00%. The patient is awake, alert, conversnat. he follows complex commands bilaterally with good strength. His speech is fluent, but he is fixated on going home to an inappropriate degree.  Assessment/Plan: Impression is that of a mild CHI in male with etoh abuse. Plan is for over night obs and repeat Ct am tomorrow.  Reinaldo Meeker, MD 03/30/2012, 2:46 PM

## 2012-03-30 NOTE — ED Notes (Signed)
Pt advised to not sit up in bed, pt attempting to take ccollar off, explained to pt this was for his protection. Pt states he will just take a nap till he has a ride.

## 2012-03-30 NOTE — ED Notes (Signed)
Pt has taken off collar and monitor, will not stay still, EDP aware, family at bedside.

## 2012-03-30 NOTE — ED Notes (Addendum)
Consent signed at [id number]. Dr Janee Morn at bedside to place left sided chest tube for hemopneumothorax. Pt given versed and fentanyl prior to procedure. Pt placed on 2L of O2. Pt tolerated procedure well. Chest tube connected to [place] to 20cm of suction. Will get post procedure chest xray then transport pt to 3100.

## 2012-03-30 NOTE — ED Notes (Signed)
RES at bedside speaking with family

## 2012-03-30 NOTE — H&P (Signed)
Raymond Quinn is an 61 y.o. male.   Chief Complaint: Larey Seat at home HPI: 61 yr old male who was brought to [facility] by EMS who had fallen at his home.  He was a ground level fall per the son who was present in the house but did not actually see the fall.  The patients ETOH level was over 200.  He does not remember falling and is not very cooperative.  He reports pain in his upper back and left shoulder.  He admits to drinking a lot of alcohol.  History reviewed. No pertinent past medical history.  Past Surgical History  Procedure Date  . Knee arthroscopy     History reviewed. No pertinent family history. Social History:  reports that he has been smoking Cigars.  He does not have any smokeless tobacco history on file. He reports that he drinks alcohol. He reports that he does not use illicit drugs.  Allergies:  Allergies  Allergen Reactions  . Penicillins Swelling     (Not in a hospital admission)  Results for orders placed during the hospital encounter of 03/30/12 (from the past 48 hour(s))  CBC WITH DIFFERENTIAL     Status: Abnormal   Collection Time   03/30/12 11:56 AM      Component Value Range Comment   WBC 12.3 (*) 4.0 - 10.5 K/uL    RBC 4.37  4.22 - 5.81 MIL/uL    Hemoglobin 16.0  13.0 - 17.0 g/dL    HCT 11.9  14.7 - 82.9 %    MCV 104.6 (*) 78.0 - 100.0 fL    MCH 36.6 (*) 26.0 - 34.0 pg    MCHC 35.0  30.0 - 36.0 g/dL    RDW 56.2  13.0 - 86.5 %    Platelets 129 (*) 150 - 400 K/uL    Neutrophils Relative 60  43 - 77 %    Lymphocytes Relative 30  12 - 46 %    Monocytes Relative 8  3 - 12 %    Eosinophils Relative 1  0 - 5 %    Basophils Relative 1  0 - 1 %    Neutro Abs 7.4  1.7 - 7.7 K/uL    Lymphs Abs 3.7  0.7 - 4.0 K/uL    Monocytes Absolute 1.0  0.1 - 1.0 K/uL    Eosinophils Absolute 0.1  0.0 - 0.7 K/uL    Basophils Absolute 0.1  0.0 - 0.1 K/uL    Smear Review MORPHOLOGY UNREMARKABLE     HEPATIC FUNCTION PANEL     Status: Normal   Collection Time   03/30/12 11:56 AM        Component Value Range Comment   Total Protein 6.6  6.0 - 8.3 g/dL    Albumin 3.6  3.5 - 5.2 g/dL    AST 37  0 - 37 U/L    ALT 20  0 - 53 U/L    Alkaline Phosphatase 50  39 - 117 U/L    Total Bilirubin 1.0  0.3 - 1.2 mg/dL    Bilirubin, Direct 0.2  0.0 - 0.3 mg/dL    Indirect Bilirubin 0.8  0.3 - 0.9 mg/dL   PROTIME-INR     Status: Normal   Collection Time   03/30/12 11:56 AM      Component Value Range Comment   Prothrombin Time 13.7  11.6 - 15.2 seconds    INR 1.03  0.00 - 1.49   APTT  Status: Normal   Collection Time   03/30/12 11:56 AM      Component Value Range Comment   aPTT 29  24 - 37 seconds   AMMONIA     Status: Normal   Collection Time   03/30/12 11:56 AM      Component Value Range Comment   Ammonia 27  11 - 60 umol/L   POCT I-STAT, CHEM 8     Status: Abnormal   Collection Time   03/30/12 12:09 PM      Component Value Range Comment   Sodium 140  135 - 145 mEq/L    Potassium 3.4 (*) 3.5 - 5.1 mEq/L    Chloride 104  96 - 112 mEq/L    BUN <3 (*) 6 - 23 mg/dL    Creatinine, Ser 9.56  0.50 - 1.35 mg/dL    Glucose, Bld 213 (*) 70 - 99 mg/dL    Calcium, Ion 0.86 (*) 1.13 - 1.30 mmol/L    TCO2 22  0 - 100 mmol/L    Hemoglobin 16.7  13.0 - 17.0 g/dL    HCT 57.8  46.9 - 62.9 %   ETHANOL     Status: Abnormal   Collection Time   03/30/12 12:32 PM      Component Value Range Comment   Alcohol, Ethyl (B) 228 (*) 0 - 11 mg/dL    Dg Chest 2 View  01/13/4131  *RADIOLOGY REPORT*  Clinical Data: Status post fall.  Pain.  CHEST - 2 VIEW  Comparison: None.  Findings: The patient has a small left pneumothorax estimated at 10- 15%.  There is some left basilar atelectasis.  The right lung is clear.  No pleural effusion.  Heart size is normal.  There are displaced fractures of the left fourth, fifth and sixth ribs.  Left clavicle fracture appears nondisplaced. Heart size is normal.  IMPRESSION: Small left pneumothorax due to displaced fractures of the left fourth through sixth ribs.  Left clavicle fracture is also seen.  Critical Value/emergent results were called by telephone at the time of interpretation on 03/30/2012 at 1:45 p.m. to Dr. Rubin Payor, who verbally acknowledged these results.  Original Report Authenticated By: Bernadene Bell. Maricela Curet, M.D.   Dg Thoracic Spine 2 View  03/30/2012  *RADIOLOGY REPORT*  Clinical Data: Fall, pain.  THORACIC SPINE - 2 VIEW  Comparison: None.  Findings: Vertebral body height and alignment are maintained with mild convex right thoracic scoliosis noted.  The patient has left fourth through sixth rib fractures of the left clavicle fracture.  IMPRESSION: Left 4-6 rib fractures and left clavicle fracture.  Negative for spine fracture.  Original Report Authenticated By: Bernadene Bell. D'ALESSIO, M.D.   Dg Lumbar Spine 2-3 Views  03/30/2012  *RADIOLOGY REPORT*  Clinical Data: Status post fall.  LUMBAR SPINE - 2-3 VIEW  Comparison: None.  Findings: Vertebral body height and alignment are maintained. There is some anterior endplate spurring.  Facet arthropathy lower lumbar spine is noted.  Mild convex left scoliosis is seen.  IMPRESSION: No acute finding.  Original Report Authenticated By: Bernadene Bell. Maricela Curet, M.D.   Ct Head Wo Contrast  03/30/2012  *RADIOLOGY REPORT*  Clinical Data:  Fall, left scalp hematoma, initially unresponsive but improved. Left T attempt.  CT HEAD WITHOUT CONTRAST CT CERVICAL SPINE WITHOUT CONTRAST  Technique:  Multidetector CT imaging of the head and cervical spine was performed following the standard protocol without intravenous contrast.  Multiplanar CT image reconstructions of the cervical spine were also generated.  Comparison:  10/27/2011.  CT HEAD  Findings: Moderate sized left temporal and left parietal scalp hematoma.  There is a left temporal squama skull fracture which was identified previously which appears slightly wider on today's exam suggesting reinjury.  There is evidence for pneumocephalus in the left middle cranial fossa  (image 15), and air  around the left temporalis muscle, and air within the carotid canal.  There is extensive blood  in the left external canal and left middle ear/mastoid suspicious for a  left temporal bone fracture.  CT temporal bone recommended for further evaluation.  Mild atrophy is present.  There is subarachnoid blood most prominently seen in the right sylvian fissure, as well as a small posterior frontal opercular subcortical 6 x 8 mm shearing injury. This may be associated with a small right posterior frontal subdural collection (image 20) in the right suprasylvian region. A lesser amount of subarachnoid blood is suspected in the left middle cranial fossa laterally.  Mild atrophy is present.  No areas of cortical infarction are seen. Mild chronic microvascular ischemic changes suspected in the deep [place] Knick matter.  An air-fluid level is present in the right maxillary sinus.  This is incompletely evaluated, but in the setting of trauma could represent a right orbital blowout injury.  CT maxillofacial could provide additional information.  Air is seen in both cavernous sinuses which could be iatrogenic from intravenous catheter placement.  Mild mucosal thickening noted in the ethmoid sinuses. Frontal sinuses clear.  Mild mucosal thickening in the sphenoid.  IMPRESSION: Left temporal squama fracture appears similar to the injury in March, but slightly wider and may have been reinjured with the current fall.  There is a moderate-sized left temporal parietal scalp hematoma.  Bilateral right greater than left subarachnoid blood, most notably right sylvian fissure, with a small 6 x 8 mm shearing injury right posterior frontal operculum with a minimal extra-axial hematoma associated.  Air-fluid level right maxillary sinus could indicate a blowout injury.  Correlate clinically.  CT maxillofacial could be helpful in further evaluation.  Left middle cranial fossa pneumocephalus along with air in and around the carotid  canal and left temporalis muscle likely representing temporal bone fracture.  There is evidence for blood in the middle ear/mastoid and external canal on the left.  CT temporal bone could provide additional information.  CT CERVICAL SPINE  Findings: There is no visible cervical spine fracture or traumatic subluxation.  Disc space narrowing is noted at C5-6 and C6-7 with moderate uncinate spurring and osteophyte formation.  There is no prevertebral soft tissue swelling or intraspinal hematoma.  No neck masses are identified.  Suspect left pleural effusion but incompletely evaluated.  Trachea midline.  IMPRESSION: Cervical spondylosis as described.  No visible cervical spine fracture.  Suspect left pleural effusion, incompletely evaluated.  Critical Value/emergent results were called by telephone at the time of interpretation on 03/30/2012 at 1:20 p.m. to Dr. Rubin Payor, who verbally acknowledged these results.  Original Report Authenticated By: Elsie Stain, M.D.   Ct Cervical Spine Wo Contrast  03/30/2012  *RADIOLOGY REPORT*  Clinical Data:  Fall, left scalp hematoma, initially unresponsive but improved. Left T attempt.  CT HEAD WITHOUT CONTRAST CT CERVICAL SPINE WITHOUT CONTRAST  Technique:  Multidetector CT imaging of the head and cervical spine was performed following the standard protocol without intravenous contrast.  Multiplanar CT image reconstructions of the cervical spine were also generated.  Comparison:  10/27/2011.  CT HEAD  Findings: Moderate sized left temporal and left  parietal scalp hematoma.  There is a left temporal squama skull fracture which was identified previously which appears slightly wider on today's exam suggesting reinjury.  There is evidence for pneumocephalus in the left middle cranial fossa (image 15), and air  around the left temporalis muscle, and air within the carotid canal.  There is extensive blood  in the left external canal and left middle ear/mastoid suspicious for a  left  temporal bone fracture.  CT temporal bone recommended for further evaluation.  Mild atrophy is present.  There is subarachnoid blood most prominently seen in the right sylvian fissure, as well as a small posterior frontal opercular subcortical 6 x 8 mm shearing injury. This may be associated with a small right posterior frontal subdural collection (image 20) in the right suprasylvian region. A lesser amount of subarachnoid blood is suspected in the left middle cranial fossa laterally.  Mild atrophy is present.  No areas of cortical infarction are seen. Mild chronic microvascular ischemic changes suspected in the deep Danarius Mcconathy matter.  An air-fluid level is present in the right maxillary sinus.  This is incompletely evaluated, but in the setting of trauma could represent a right orbital blowout injury.  CT maxillofacial could provide additional information.  Air is seen in both cavernous sinuses which could be iatrogenic from intravenous catheter placement.  Mild mucosal thickening noted in the ethmoid sinuses. Frontal sinuses clear.  Mild mucosal thickening in the sphenoid.  IMPRESSION: Left temporal squama fracture appears similar to the injury in March, but slightly wider and may have been reinjured with the current fall.  There is a moderate-sized left temporal parietal scalp hematoma.  Bilateral right greater than left subarachnoid blood, most notably right sylvian fissure, with a small 6 x 8 mm shearing injury right posterior frontal operculum with a minimal extra-axial hematoma associated.  Air-fluid level right maxillary sinus could indicate a blowout injury.  Correlate clinically.  CT maxillofacial could be helpful in further evaluation.  Left middle cranial fossa pneumocephalus along with air in and around the carotid canal and left temporalis muscle likely representing temporal bone fracture.  There is evidence for blood in the middle ear/mastoid and external canal on the left.  CT temporal bone could provide  additional information.  CT CERVICAL SPINE  Findings: There is no visible cervical spine fracture or traumatic subluxation.  Disc space narrowing is noted at C5-6 and C6-7 with moderate uncinate spurring and osteophyte formation.  There is no prevertebral soft tissue swelling or intraspinal hematoma.  No neck masses are identified.  Suspect left pleural effusion but incompletely evaluated.  Trachea midline.  IMPRESSION: Cervical spondylosis as described.  No visible cervical spine fracture.  Suspect left pleural effusion, incompletely evaluated.  Critical Value/emergent results were called by telephone at the time of interpretation on 03/30/2012 at 1:20 p.m. to Dr. Rubin Payor, who verbally acknowledged these results.  Original Report Authenticated By: Elsie Stain, M.D.    Review of Systems  Unable to perform ROS: medical condition  Musculoskeletal: Positive for back pain.       Shoulder pain  Psychiatric/Behavioral: Positive for substance abuse (ETOH daily).    Blood pressure 130/87, pulse 92, temperature 97.5 F (36.4 C), resp. rate 18, SpO2 95.00%. Physical Exam  Constitutional: He appears well-developed and well-nourished.  HENT:       Blood surrounding left ear with blood caking left side of head, abrasion on left scalp  Blood in left ear canal, hemotympana present left side.  Right Ok.  Eyes: EOM are normal. Pupils are equal, round, and reactive to light.  Neck: No tracheal deviation present.       In c-collar  Cardiovascular: Normal rate and regular rhythm.   Respiratory: Effort normal and breath sounds normal.  GI: Soft. Bowel sounds are normal. There is no tenderness.  Genitourinary:       deferred  Neurological: He is alert. No cranial nerve deficit.  Skin: Skin is warm and dry.  Psychiatric:       Not very coorepartive, wanting to leave and go home,      Assessment/Plan   Ground Level Fall, ETOH involvement 1.  Rib fractures with small left pneumothorax: no chest tube at  this time, O2 to maintain sats, serial CXR to monitor, Facial CT to be done may show the pneumo better. 2.  TBI: neurosurgery consulted, awaiting further recs, appreciate assistance, will need repeat exam later to look for ongoing bleeding 3.  Left clavicle Fracture: orthopedist consulted, keep left arm in sling for now and immobile 4.  ETOH abuse: significant risk for DT's, will start on protocol and have social worker speak with patient about outpatient treatment as well.   Edina Winningham 03/30/2012, 2:37 PM

## 2012-03-30 NOTE — ED Notes (Signed)
Family at bedside. Pt removing ccollar, placed back, EDP made aware

## 2012-03-30 NOTE — H&P (Signed)
I D/W Dr. Gerlene Fee in the ED.  Will consult ortho.  CT face, temp bone, C/A/P are pending Patient examined and I agree with the assessment and plan  Violeta Gelinas, MD, MPH, FACS Pager: [phone]  03/30/2012 3:10 PM

## 2012-03-30 NOTE — ED Notes (Signed)
Explained to Patient and family members we need a urine sample. Patient stated he was unable to urinate at this time.

## 2012-03-30 NOTE — ED Notes (Signed)
Receiving RN updated on pts condition, pt transported to floor

## 2012-03-30 NOTE — ED Provider Notes (Signed)
History     CSN: [id number]  Arrival date & time 03/30/12  1150   First MD Initiated Contact with Patient 03/30/12 1156      No chief complaint on file.   (Consider location/radiation/quality/duration/timing/severity/associated sxs/prior treatment) Patient is a 61 y.o. male presenting with fall. The history is provided by the EMS personnel.  Fall The accident occurred less than 1 hour ago. The fall occurred in unknown circumstances. He fell from an unknown height. He landed on concrete. The volume of blood lost was minimal. The point of impact was the head. The patient is experiencing no pain. He was not ambulatory at the scene. There was alcohol use involved in the accident. Associated symptoms include loss of consciousness. Pertinent negatives include no vomiting. Treatment on scene includes a c-collar and a backboard.    No past medical history on file.  Past Surgical History  Procedure Date  . Knee arthroscopy     No family history on file.  History  Substance Use Topics  . Smoking status: Current Everyday Smoker    Types: Cigars  . Smokeless tobacco: Not on file  . Alcohol Use: Yes      Review of Systems  Unable to perform ROS: Other  Gastrointestinal: Negative for vomiting.  Neurological: Positive for loss of consciousness.    Allergies  Penicillins  Home Medications   Current Outpatient Rx  Name Route Sig Dispense Refill  . LACRI-LUBE S.O.P. OP OINT Ophthalmic Apply 1 inch to eye every 6 (six) hours as needed. 1 Tube 0  . TEARS RENEWED OP SOLN Left Eye Place 1 drop into the left eye 4 (four) times daily as needed. 15 mL 12  . IBUPROFEN IB PO Oral Take 1 tablet by mouth daily as needed. Patient used this medication for his ear pain.    Marland Kitchen NAPROXEN SODIUM 220 MG PO TABS Oral Take 220 mg by mouth 2 (two) times daily as needed. For pain      BP 150/91  Pulse 86  Resp 15  SpO2 96%  Physical Exam  Nursing note and vitals reviewed. Constitutional: He  appears well-developed and well-nourished.  HENT:  Head: Normocephalic.    Right Ear: Tympanic membrane and external ear normal.  Nose: Nose normal. No septal deviation or nasal septal hematoma.       Left ear canal with blood, unable to visualize TM. Dried blood at nares without evidence of active epistaxis  Eyes: EOM are normal. Pupils are equal, round, and reactive to light.  Neck: Neck supple.  Cardiovascular: Normal rate, regular rhythm, normal heart sounds and intact distal pulses.   Pulmonary/Chest: Effort normal and breath sounds normal. He exhibits tenderness.  Abdominal: Soft. He exhibits no distension. There is no tenderness.  Musculoskeletal: He exhibits no tenderness.  Lymphadenopathy:    He has no cervical adenopathy.  Neurological: He is alert. He has normal strength. He is disoriented. No cranial nerve deficit. He exhibits normal muscle tone. GCS eye subscore is 4. GCS verbal subscore is 4. GCS motor subscore is 6.  Skin: Skin is warm and dry.    ED Course  Procedures (including critical care time)  Labs Reviewed  CBC WITH DIFFERENTIAL - Abnormal; Notable for the following:    WBC 12.3 (*)     MCV 104.6 (*)     MCH 36.6 (*)     Platelets 129 (*)     All other components within normal limits  ETHANOL - Abnormal; Notable for the following:  Alcohol, Ethyl (B) 228 (*)     All other components within normal limits  POCT I-STAT, CHEM 8 - Abnormal; Notable for the following:    Potassium 3.4 (*)     BUN <3 (*)     Glucose, Bld 126 (*)     Calcium, Ion 1.08 (*)     All other components within normal limits  HEPATIC FUNCTION PANEL  PROTIME-INR  APTT  AMMONIA  URINALYSIS, WITH MICROSCOPIC   Dg Chest 2 View  03/30/2012  *RADIOLOGY REPORT*  Clinical Data: Status post fall.  Pain.  CHEST - 2 VIEW  Comparison: None.  Findings: The patient has a small left pneumothorax estimated at 10- 15%.  There is some left basilar atelectasis.  The right lung is clear.  No  pleural effusion.  Heart size is normal.  There are displaced fractures of the left fourth, fifth and sixth ribs.  Left clavicle fracture appears nondisplaced. Heart size is normal.  IMPRESSION: Small left pneumothorax due to displaced fractures of the left fourth through sixth ribs. Left clavicle fracture is also seen.  Critical Value/emergent results were called by telephone at the time of interpretation on 03/30/2012 at 1:45 p.m. to Dr. Rubin Payor, who verbally acknowledged these results.  Original Report Authenticated By: Bernadene Bell. Maricela Curet, M.D.   Dg Thoracic Spine 2 View  03/30/2012  *RADIOLOGY REPORT*  Clinical Data: Fall, pain.  THORACIC SPINE - 2 VIEW  Comparison: None.  Findings: Vertebral body height and alignment are maintained with mild convex right thoracic scoliosis noted.  The patient has left fourth through sixth rib fractures of the left clavicle fracture.  IMPRESSION: Left 4-6 rib fractures and left clavicle fracture.  Negative for spine fracture.  Original Report Authenticated By: Bernadene Bell. D'ALESSIO, M.D.   Dg Lumbar Spine 2-3 Views  03/30/2012  *RADIOLOGY REPORT*  Clinical Data: Status post fall.  LUMBAR SPINE - 2-3 VIEW  Comparison: None.  Findings: Vertebral body height and alignment are maintained. There is some anterior endplate spurring.  Facet arthropathy lower lumbar spine is noted.  Mild convex left scoliosis is seen.  IMPRESSION: No acute finding.  Original Report Authenticated By: Bernadene Bell. Maricela Curet, M.D.   Ct Head Wo Contrast  03/30/2012  *RADIOLOGY REPORT*  Clinical Data:  Fall, left scalp hematoma, initially unresponsive but improved. Left T attempt.  CT HEAD WITHOUT CONTRAST CT CERVICAL SPINE WITHOUT CONTRAST  Technique:  Multidetector CT imaging of the head and cervical spine was performed following the standard protocol without intravenous contrast.  Multiplanar CT image reconstructions of the cervical spine were also generated.  Comparison:  10/27/2011.  CT HEAD   Findings: Moderate sized left temporal and left parietal scalp hematoma.  There is a left temporal squama skull fracture which was identified previously which appears slightly wider on today's exam suggesting reinjury.  There is evidence for pneumocephalus in the left middle cranial fossa (image 15), and air  around the left temporalis muscle, and air within the carotid canal.  There is extensive blood  in the left external canal and left middle ear/mastoid suspicious for a  left temporal bone fracture.  CT temporal bone recommended for further evaluation.  Mild atrophy is present.  There is subarachnoid blood most prominently seen in the right sylvian fissure, as well as a small posterior frontal opercular subcortical 6 x 8 mm shearing injury. This may be associated with a small right posterior frontal subdural collection (image 20) in the right suprasylvian region. A lesser amount of subarachnoid  blood is suspected in the left middle cranial fossa laterally.  Mild atrophy is present.  No areas of cortical infarction are seen. Mild chronic microvascular ischemic changes suspected in the deep white matter.  An air-fluid level is present in the right maxillary sinus.  This is incompletely evaluated, but in the setting of trauma could represent a right orbital blowout injury.  CT maxillofacial could provide additional information.  Air is seen in both cavernous sinuses which could be iatrogenic from intravenous catheter placement.  Mild mucosal thickening noted in the ethmoid sinuses. Frontal sinuses clear.  Mild mucosal thickening in the sphenoid.  IMPRESSION: Left temporal squama fracture appears similar to the injury in March, but slightly wider and may have been reinjured with the current fall.  There is a moderate-sized left temporal parietal scalp hematoma.  Bilateral right greater than left subarachnoid blood, most notably right sylvian fissure, with a small 6 x 8 mm shearing injury right posterior frontal  operculum with a minimal extra-axial hematoma associated.  Air-fluid level right maxillary sinus could indicate a blowout injury.  Correlate clinically.  CT maxillofacial could be helpful in further evaluation.  Left middle cranial fossa pneumocephalus along with air in and around the carotid canal and left temporalis muscle likely representing temporal bone fracture.  There is evidence for blood in the middle ear/mastoid and external canal on the left.  CT temporal bone could provide additional information.  CT CERVICAL SPINE  Findings: There is no visible cervical spine fracture or traumatic subluxation.  Disc space narrowing is noted at C5-6 and C6-7 with moderate uncinate spurring and osteophyte formation.  There is no prevertebral soft tissue swelling or intraspinal hematoma.  No neck masses are identified.  Suspect left pleural effusion but incompletely evaluated.  Trachea midline.  IMPRESSION: Cervical spondylosis as described.  No visible cervical spine fracture.  Suspect left pleural effusion, incompletely evaluated.  Critical Value/emergent results were called by telephone at the time of interpretation on 03/30/2012 at 1:20 p.m. to Dr. Rubin Payor, who verbally acknowledged these results.  Original Report Authenticated By: Elsie Stain, M.D.   Ct Cervical Spine Wo Contrast  03/30/2012  *RADIOLOGY REPORT*  Clinical Data:  Fall, left scalp hematoma, initially unresponsive but improved. Left T attempt.  CT HEAD WITHOUT CONTRAST CT CERVICAL SPINE WITHOUT CONTRAST  Technique:  Multidetector CT imaging of the head and cervical spine was performed following the standard protocol without intravenous contrast.  Multiplanar CT image reconstructions of the cervical spine were also generated.  Comparison:  10/27/2011.  CT HEAD  Findings: Moderate sized left temporal and left parietal scalp hematoma.  There is a left temporal squama skull fracture which was identified previously which appears slightly wider on  today's exam suggesting reinjury.  There is evidence for pneumocephalus in the left middle cranial fossa (image 15), and air  around the left temporalis muscle, and air within the carotid canal.  There is extensive blood  in the left external canal and left middle ear/mastoid suspicious for a  left temporal bone fracture.  CT temporal bone recommended for further evaluation.  Mild atrophy is present.  There is subarachnoid blood most prominently seen in the right sylvian fissure, as well as a small posterior frontal opercular subcortical 6 x 8 mm shearing injury. This may be associated with a small right posterior frontal subdural collection (image 20) in the right suprasylvian region. A lesser amount of subarachnoid blood is suspected in the left middle cranial fossa laterally.  Mild atrophy is  present.  No areas of cortical infarction are seen. Mild chronic microvascular ischemic changes suspected in the deep white matter.  An air-fluid level is present in the right maxillary sinus.  This is incompletely evaluated, but in the setting of trauma could represent a right orbital blowout injury.  CT maxillofacial could provide additional information.  Air is seen in both cavernous sinuses which could be iatrogenic from intravenous catheter placement.  Mild mucosal thickening noted in the ethmoid sinuses. Frontal sinuses clear.  Mild mucosal thickening in the sphenoid.  IMPRESSION: Left temporal squama fracture appears similar to the injury in March, but slightly wider and may have been reinjured with the current fall.  There is a moderate-sized left temporal parietal scalp hematoma.  Bilateral right greater than left subarachnoid blood, most notably right sylvian fissure, with a small 6 x 8 mm shearing injury right posterior frontal operculum with a minimal extra-axial hematoma associated.  Air-fluid level right maxillary sinus could indicate a blowout injury.  Correlate clinically.  CT maxillofacial could be helpful  in further evaluation.  Left middle cranial fossa pneumocephalus along with air in and around the carotid canal and left temporalis muscle likely representing temporal bone fracture.  There is evidence for blood in the middle ear/mastoid and external canal on the left.  CT temporal bone could provide additional information.  CT CERVICAL SPINE  Findings: There is no visible cervical spine fracture or traumatic subluxation.  Disc space narrowing is noted at C5-6 and C6-7 with moderate uncinate spurring and osteophyte formation.  There is no prevertebral soft tissue swelling or intraspinal hematoma.  No neck masses are identified.  Suspect left pleural effusion but incompletely evaluated.  Trachea midline.  IMPRESSION: Cervical spondylosis as described.  No visible cervical spine fracture.  Suspect left pleural effusion, incompletely evaluated.  Critical Value/emergent results were called by telephone at the time of interpretation on 03/30/2012 at 1:20 p.m. to Dr. Rubin Payor, who verbally acknowledged these results.  Original Report Authenticated By: Elsie Stain, M.D.     1. Fall down steps   2. Subarachnoid bleed   3. Temporal bone fracture   4. Fracture of left clavicle   5. Multiple fractures of ribs of left side   6. Pneumothorax, left       MDM  61 yo male with chronic ETOH problems with unwitnessed fall. Found to be with agonal respirations by EMS, aroused with BVM temporarily. GCS 14 (confusion with dates) on arrival, maintaining airway with normal vital signs. Exam as above, mild chest wall tenderness on re-examination. PTX seen on CXR. Discussed with Dr. Janee Morn, will send to CT scanner for Chest and abd scans. No hypoxia or hypotension, PTX will wait for trauma eval. Cleared neck per Dr. Beverlyn Roux of [facility]. Will admit to trauma.        Pricilla Loveless, MD 03/30/12 1455

## 2012-03-30 NOTE — ED Notes (Signed)
Went into Patient room to do a ECG and also try to get a urine sample.  Patient had gone to CT.

## 2012-03-30 NOTE — ED Notes (Signed)
ECG given to Dr. Rubin Payor, no older ECG in [facility]

## 2012-03-30 NOTE — Procedures (Addendum)
Chest Tube Insertion Under conscious sedation ( ) Procedure Note  Indications:  Clinically significant Pneumothorax and Hemothorax  Pre-operative Diagnosis: Pneumothorax and Hemothorax  Post-operative Diagnosis: Pneumothorax and Hemothorax  Procedure Details  Informed consent was obtained from his wife. Patient was monitored throughout.  Fentanyl and versed given. Informed consent was obtained for the procedure, including sedation.  Risks of lung perforation, hemorrhage, arrhythmia, and adverse drug reaction were discussed. Conscious sedation 15 minutes.  After sterile skin prep, using standard technique, a 28 French tube was placed in the left anterior axillary line, nipple level  Findings: Large air return, 50cc blood  Estimated Blood Loss:  less than 100 mL         Specimens:  None              Complications:  None; patient tolerated the procedure well.         Disposition: ICU - extubated and stable.         Condition: stable Violeta Gelinas, MD, MPH, FACS Pager: [phone]

## 2012-03-30 NOTE — Consult Note (Signed)
Reason for Consult: Left clavicle fracture Referring Physician: [name] is an 61 y.o. male.  HPI: Larey Seat down stairs. Seen EDP. [facility]. Rib fxs.  Past Medical History  Diagnosis Date  . No pertinent past medical history     Past Surgical History  Procedure Date  . Knee arthroscopy     History reviewed. No pertinent family history.  Social History:  reports that he has been smoking Cigars.  He does not have any smokeless tobacco history on file. He reports that he drinks about 2.5 ounces of alcohol per week. He reports that he does not use illicit drugs.  Allergies:  Allergies  Allergen Reactions  . Penicillins Swelling    Medications: I have reviewed the patient's current medications.  Results for orders placed during the hospital encounter of 03/30/12 (from the past 48 hour(s))  CBC WITH DIFFERENTIAL     Status: Abnormal   Collection Time   03/30/12 11:56 AM      Component Value Range Comment   WBC 12.3 (*) 4.0 - 10.5 K/uL    RBC 4.37  4.22 - 5.81 MIL/uL    Hemoglobin 16.0  13.0 - 17.0 g/dL    HCT 40.9  81.1 - 91.4 %    MCV 104.6 (*) 78.0 - 100.0 fL    MCH 36.6 (*) 26.0 - 34.0 pg    MCHC 35.0  30.0 - 36.0 g/dL    RDW 78.2  95.6 - 21.3 %    Platelets 129 (*) 150 - 400 K/uL    Neutrophils Relative 60  43 - 77 %    Lymphocytes Relative 30  12 - 46 %    Monocytes Relative 8  3 - 12 %    Eosinophils Relative 1  0 - 5 %    Basophils Relative 1  0 - 1 %    Neutro Abs 7.4  1.7 - 7.7 K/uL    Lymphs Abs 3.7  0.7 - 4.0 K/uL    Monocytes Absolute 1.0  0.1 - 1.0 K/uL    Eosinophils Absolute 0.1  0.0 - 0.7 K/uL    Basophils Absolute 0.1  0.0 - 0.1 K/uL    Smear Review MORPHOLOGY UNREMARKABLE     HEPATIC FUNCTION PANEL     Status: Normal   Collection Time   03/30/12 11:56 AM      Component Value Range Comment   Total Protein 6.6  6.0 - 8.3 g/dL    Albumin 3.6  3.5 - 5.2 g/dL    AST 37  0 - 37 U/L    ALT 20  0 - 53 U/L    Alkaline Phosphatase 50  39 - 117 U/L      Total Bilirubin 1.0  0.3 - 1.2 mg/dL    Bilirubin, Direct 0.2  0.0 - 0.3 mg/dL    Indirect Bilirubin 0.8  0.3 - 0.9 mg/dL   PROTIME-INR     Status: Normal   Collection Time   03/30/12 11:56 AM      Component Value Range Comment   Prothrombin Time 13.7  11.6 - 15.2 seconds    INR 1.03  0.00 - 1.49   APTT     Status: Normal   Collection Time   03/30/12 11:56 AM      Component Value Range Comment   aPTT 29  24 - 37 seconds   AMMONIA     Status: Normal   Collection Time   03/30/12 11:56 AM  Component Value Range Comment   Ammonia 27  11 - 60 umol/L   POCT I-STAT, CHEM 8     Status: Abnormal   Collection Time   03/30/12 12:09 PM      Component Value Range Comment   Sodium 140  135 - 145 mEq/L    Potassium 3.4 (*) 3.5 - 5.1 mEq/L    Chloride 104  96 - 112 mEq/L    BUN <3 (*) 6 - 23 mg/dL    Creatinine, Ser 1.61  0.50 - 1.35 mg/dL    Glucose, Bld 096 (*) 70 - 99 mg/dL    Calcium, Ion 0.45 (*) 1.13 - 1.30 mmol/L    TCO2 22  0 - 100 mmol/L    Hemoglobin 16.7  13.0 - 17.0 g/dL    HCT 40.9  81.1 - 91.4 %   ETHANOL     Status: Abnormal   Collection Time   03/30/12 12:32 PM      Component Value Range Comment   Alcohol, Ethyl (B) 228 (*) 0 - 11 mg/dL   URINALYSIS, WITH MICROSCOPIC     Status: Abnormal   Collection Time   03/30/12  4:08 PM      Component Value Range Comment   Color, Urine YELLOW  YELLOW    APPearance HAZY (*) CLEAR    Specific Gravity, Urine 1.024  1.005 - 1.030    pH 6.5  5.0 - 8.0    Glucose, UA NEGATIVE  NEGATIVE mg/dL    Hgb urine dipstick MODERATE (*) NEGATIVE    Bilirubin Urine NEGATIVE  NEGATIVE    Ketones, ur 15 (*) NEGATIVE mg/dL    Protein, ur 30 (*) NEGATIVE mg/dL    Urobilinogen, UA 1.0  0.0 - 1.0 mg/dL    Nitrite NEGATIVE  NEGATIVE    Leukocytes, UA NEGATIVE  NEGATIVE    WBC, UA 0-2  <3 WBC/hpf    RBC / HPF 21-50  <3 RBC/hpf    Bacteria, UA RARE  RARE    Squamous Epithelial / LPF RARE  RARE    Casts HYALINE CASTS (*) NEGATIVE   SURGICAL PCR  SCREEN     Status: Normal   Collection Time   03/30/12  4:48 PM      Component Value Range Comment   MRSA, PCR NEGATIVE  NEGATIVE    Staphylococcus aureus NEGATIVE  NEGATIVE     Dg Chest 2 View  03/30/2012  *RADIOLOGY REPORT*  Clinical Data: Status post fall.  Pain.  CHEST - 2 VIEW  Comparison: None.  Findings: The patient has a small left pneumothorax estimated at 10- 15%.  There is some left basilar atelectasis.  The right lung is clear.  No pleural effusion.  Heart size is normal.  There are displaced fractures of the left fourth, fifth and sixth ribs.  Left clavicle fracture appears nondisplaced. Heart size is normal.  IMPRESSION: Small left pneumothorax due to displaced fractures of the left fourth through sixth ribs. Left clavicle fracture is also seen.  Critical Value/emergent results were called by telephone at the time of interpretation on 03/30/2012 at 1:45 p.m. to Dr. Rubin Payor, who verbally acknowledged these results.  Original Report Authenticated By: Bernadene Bell. Maricela Curet, M.D.   Dg Thoracic Spine 2 View  03/30/2012  *RADIOLOGY REPORT*  Clinical Data: Fall, pain.  THORACIC SPINE - 2 VIEW  Comparison: None.  Findings: Vertebral body height and alignment are maintained with mild convex right thoracic scoliosis noted.  The patient has left fourth  through sixth rib fractures of the left clavicle fracture.  IMPRESSION: Left 4-6 rib fractures and left clavicle fracture.  Negative for spine fracture.  Original Report Authenticated By: Bernadene Bell. D'ALESSIO, M.D.   Dg Lumbar Spine 2-3 Views  03/30/2012  *RADIOLOGY REPORT*  Clinical Data: Status post fall.  LUMBAR SPINE - 2-3 VIEW  Comparison: None.  Findings: Vertebral body height and alignment are maintained. There is some anterior endplate spurring.  Facet arthropathy lower lumbar spine is noted.  Mild convex left scoliosis is seen.  IMPRESSION: No acute finding.  Original Report Authenticated By: Bernadene Bell. Maricela Curet, M.D.   Ct Head Wo  Contrast  03/30/2012  **ADDENDUM** CREATED: 03/30/2012 16:11:05  Tiny amount of medial left apex extrapleural air associated with left hemothorax; please see chest CT dictation for additional findings.  **END ADDENDUM** SIGNED BY: Elsie Stain, M.D.   03/30/2012  *RADIOLOGY REPORT*  Clinical Data:  Fall, left scalp hematoma, initially unresponsive but improved. Left T attempt.  CT HEAD WITHOUT CONTRAST CT CERVICAL SPINE WITHOUT CONTRAST  Technique:  Multidetector CT imaging of the head and cervical spine was performed following the standard protocol without intravenous contrast.  Multiplanar CT image reconstructions of the cervical spine were also generated.  Comparison:  10/27/2011.  CT HEAD  Findings: Moderate sized left temporal and left parietal scalp hematoma.  There is a left temporal squama skull fracture which was identified previously which appears slightly wider on today's exam suggesting reinjury.  There is evidence for pneumocephalus in the left middle cranial fossa (image 15), and air  around the left temporalis muscle, and air within the carotid canal.  There is extensive blood  in the left external canal and left middle ear/mastoid suspicious for a  left temporal bone fracture.  CT temporal bone recommended for further evaluation.  Mild atrophy is present.  There is subarachnoid blood most prominently seen in the right sylvian fissure, as well as a small posterior frontal opercular subcortical 6 x 8 mm shearing injury. This may be associated with a small right posterior frontal subdural collection (image 20) in the right suprasylvian region. A lesser amount of subarachnoid blood is suspected in the left middle cranial fossa laterally.  Mild atrophy is present.  No areas of cortical infarction are seen. Mild chronic microvascular ischemic changes suspected in the deep white matter.  An air-fluid level is present in the right maxillary sinus.  This is incompletely evaluated, but in the setting of  trauma could represent a right orbital blowout injury.  CT maxillofacial could provide additional information.  Air is seen in both cavernous sinuses which could be iatrogenic from intravenous catheter placement.  Mild mucosal thickening noted in the ethmoid sinuses. Frontal sinuses clear.  Mild mucosal thickening in the sphenoid.  IMPRESSION: Left temporal squama fracture appears similar to the injury in March, but slightly wider and may have been reinjured with the current fall.  There is a moderate-sized left temporal parietal scalp hematoma.  Bilateral right greater than left subarachnoid blood, most notably right sylvian fissure, with a small 6 x 8 mm shearing injury right posterior frontal operculum with a minimal extra-axial hematoma associated.  Air-fluid level right maxillary sinus could indicate a blowout injury.  Correlate clinically.  CT maxillofacial could be helpful in further evaluation.  Left middle cranial fossa pneumocephalus along with air in and around the carotid canal and left temporalis muscle likely representing temporal bone fracture.  There is evidence for blood in the middle ear/mastoid and external  canal on the left.  CT temporal bone could provide additional information.  CT CERVICAL SPINE  Findings: There is no visible cervical spine fracture or traumatic subluxation.  Disc space narrowing is noted at C5-6 and C6-7 with moderate uncinate spurring and osteophyte formation.  There is no prevertebral soft tissue swelling or intraspinal hematoma.  No neck masses are identified.  Suspect left pleural effusion but incompletely evaluated.  Trachea midline.  IMPRESSION: Cervical spondylosis as described.  No visible cervical spine fracture.  Suspect left pleural effusion, incompletely evaluated.  Critical Value/emergent results were called by telephone at the time of interpretation on 03/30/2012 at 1:20 p.m. to Dr. Rubin Payor, who verbally acknowledged these results.  Original Report  Authenticated By: Elsie Stain, M.D.   Ct Chest W Contrast  03/30/2012  *RADIOLOGY REPORT*  Clinical Data:  History of trauma from a fall.  Left-sided chest pain.  CT CHEST, ABDOMEN AND PELVIS WITH CONTRAST  Technique:  Multidetector CT imaging of the chest, abdomen and pelvis was performed following the standard protocol during bolus administration of intravenous contrast.  Contrast: OMNIPAQUE IOHEXOL 300 MG/ML  SOLN  Comparison:  No priors.  CT CHEST  Findings:  Mediastinum: Heart size is normal. There is no significant pericardial fluid, thickening or pericardial calcification. There is atherosclerosis of the thoracic aorta, the great vessels of the mediastinum and the coronary arteries, including calcified atherosclerotic plaque in the left anterior descending and left circumflex coronary arteries. No acute abnormality of the thoracic aorta; specifically, no aneurysm or dissection.  No abnormal fluid collection within the mediastinum to suggest mediastinal hematoma. Esophagus is unremarkable in appearance. No pathologically enlarged mediastinal or hilar lymph nodes.  Lungs/Pleura: There is a large left-sided pneumothorax, and there is some dependent intermediate attenuation (up to 42 HU) fluid within the left hemithorax, compatible with hemothorax.  Partial atelectasis of the left lung.  Right lung is well-aerated.  Musculoskeletal: Nondisplaced fracture of the anterolateral aspect of the left second rib.  Multiple other fractures of the posterior and posterolateral aspect of the left 3rd through 10th ribs, some of which are segmental and mildly displaced (e.g., the posterolateral aspect of the left sixth rib). No frank flail chest segment is identified.  There is also a mildly displaced/comminuted fracture of the distal third of the left clavicle.  Adjacent to the left clavicular fracture there is extensive soft tissue stranding, There are no aggressive appearing lytic or blastic lesions noted in the  visualized portions of the skeleton.  IMPRESSION:  1.  Extensive acute trauma to the left side of the thorax including left clavicular fracture, multiple fractures of the left ribs 2-10, soft tissue contusion/hemorrhage in the left supraclavicular and infraclavicular region, and a large left-sided hemopneumothorax, as detailed above. 2.  No evidence of acute aortic injury or mediastinal hematoma. 3. Atherosclerosis, including left anterior descending and left circumflex coronary artery disease. Assessment for potential risk factor modification, dietary therapy or pharmacologic therapy may be warranted, if clinically indicated.  CT ABDOMEN AND PELVIS  Findings:  Abdomen/Pelvis: The enhanced appearance of the liver, gallbladder, pancreas, spleen, bilateral adrenal glands and left kidney is unremarkable.  There is parenchymal atrophy throughout the lower pole of the right kidney.  No ascites or pneumoperitoneum and no pathologic distension of bowel.  No definite pathologic lymphadenopathy identified within the abdomen or pelvis.  No abnormal abdominal, pelvic or retroperitoneal fluid collections to suggest significant hemorrhage.  Prostate calcifications.  Urinary bladder is unremarkable.  Musculoskeletal: There are no aggressive  appearing lytic or blastic lesions noted in the visualized portions of the skeleton.  No acute displaced fractures in the visualized portions of the skeleton.  IMPRESSION:  1.  No evidence of significant acute traumatic injury to the abdomen or pelvis. 2.  Atrophy of the inferior pole of the right kidney.  Original Report Authenticated By: Florencia Reasons, M.D.   Ct Cervical Spine Wo Contrast  03/30/2012  **ADDENDUM** CREATED: 03/30/2012 16:11:05  Tiny amount of medial left apex extrapleural air associated with left hemothorax; please see chest CT dictation for additional findings.  **END ADDENDUM** SIGNED BY: Elsie Stain, M.D.   03/30/2012  *RADIOLOGY REPORT*  Clinical Data:  Fall,  left scalp hematoma, initially unresponsive but improved. Left T attempt.  CT HEAD WITHOUT CONTRAST CT CERVICAL SPINE WITHOUT CONTRAST  Technique:  Multidetector CT imaging of the head and cervical spine was performed following the standard protocol without intravenous contrast.  Multiplanar CT image reconstructions of the cervical spine were also generated.  Comparison:  10/27/2011.  CT HEAD  Findings: Moderate sized left temporal and left parietal scalp hematoma.  There is a left temporal squama skull fracture which was identified previously which appears slightly wider on today's exam suggesting reinjury.  There is evidence for pneumocephalus in the left middle cranial fossa (image 15), and air  around the left temporalis muscle, and air within the carotid canal.  There is extensive blood  in the left external canal and left middle ear/mastoid suspicious for a  left temporal bone fracture.  CT temporal bone recommended for further evaluation.  Mild atrophy is present.  There is subarachnoid blood most prominently seen in the right sylvian fissure, as well as a small posterior frontal opercular subcortical 6 x 8 mm shearing injury. This may be associated with a small right posterior frontal subdural collection (image 20) in the right suprasylvian region. A lesser amount of subarachnoid blood is suspected in the left middle cranial fossa laterally.  Mild atrophy is present.  No areas of cortical infarction are seen. Mild chronic microvascular ischemic changes suspected in the deep white matter.  An air-fluid level is present in the right maxillary sinus.  This is incompletely evaluated, but in the setting of trauma could represent a right orbital blowout injury.  CT maxillofacial could provide additional information.  Air is seen in both cavernous sinuses which could be iatrogenic from intravenous catheter placement.  Mild mucosal thickening noted in the ethmoid sinuses. Frontal sinuses clear.  Mild mucosal  thickening in the sphenoid.  IMPRESSION: Left temporal squama fracture appears similar to the injury in March, but slightly wider and may have been reinjured with the current fall.  There is a moderate-sized left temporal parietal scalp hematoma.  Bilateral right greater than left subarachnoid blood, most notably right sylvian fissure, with a small 6 x 8 mm shearing injury right posterior frontal operculum with a minimal extra-axial hematoma associated.  Air-fluid level right maxillary sinus could indicate a blowout injury.  Correlate clinically.  CT maxillofacial could be helpful in further evaluation.  Left middle cranial fossa pneumocephalus along with air in and around the carotid canal and left temporalis muscle likely representing temporal bone fracture.  There is evidence for blood in the middle ear/mastoid and external canal on the left.  CT temporal bone could provide additional information.  CT CERVICAL SPINE  Findings: There is no visible cervical spine fracture or traumatic subluxation.  Disc space narrowing is noted at C5-6 and C6-7 with moderate uncinate  spurring and osteophyte formation.  There is no prevertebral soft tissue swelling or intraspinal hematoma.  No neck masses are identified.  Suspect left pleural effusion but incompletely evaluated.  Trachea midline.  IMPRESSION: Cervical spondylosis as described.  No visible cervical spine fracture.  Suspect left pleural effusion, incompletely evaluated.  Critical Value/emergent results were called by telephone at the time of interpretation on 03/30/2012 at 1:20 p.m. to Dr. Rubin Payor, who verbally acknowledged these results.  Original Report Authenticated By: Elsie Stain, M.D.   Ct Abdomen Pelvis W Contrast  03/30/2012  *RADIOLOGY REPORT*  Clinical Data:  History of trauma from a fall.  Left-sided chest pain.  CT CHEST, ABDOMEN AND PELVIS WITH CONTRAST  Technique:  Multidetector CT imaging of the chest, abdomen and pelvis was performed following  the standard protocol during bolus administration of intravenous contrast.  Contrast: OMNIPAQUE IOHEXOL 300 MG/ML  SOLN  Comparison:  No priors.  CT CHEST  Findings:  Mediastinum: Heart size is normal. There is no significant pericardial fluid, thickening or pericardial calcification. There is atherosclerosis of the thoracic aorta, the great vessels of the mediastinum and the coronary arteries, including calcified atherosclerotic plaque in the left anterior descending and left circumflex coronary arteries. No acute abnormality of the thoracic aorta; specifically, no aneurysm or dissection.  No abnormal fluid collection within the mediastinum to suggest mediastinal hematoma. Esophagus is unremarkable in appearance. No pathologically enlarged mediastinal or hilar lymph nodes.  Lungs/Pleura: There is a large left-sided pneumothorax, and there is some dependent intermediate attenuation (up to 42 HU) fluid within the left hemithorax, compatible with hemothorax.  Partial atelectasis of the left lung.  Right lung is well-aerated.  Musculoskeletal: Nondisplaced fracture of the anterolateral aspect of the left second rib.  Multiple other fractures of the posterior and posterolateral aspect of the left 3rd through 10th ribs, some of which are segmental and mildly displaced (e.g., the posterolateral aspect of the left sixth rib). No frank flail chest segment is identified.  There is also a mildly displaced/comminuted fracture of the distal third of the left clavicle.  Adjacent to the left clavicular fracture there is extensive soft tissue stranding, There are no aggressive appearing lytic or blastic lesions noted in the visualized portions of the skeleton.  IMPRESSION:  1.  Extensive acute trauma to the left side of the thorax including left clavicular fracture, multiple fractures of the left ribs 2-10, soft tissue contusion/hemorrhage in the left supraclavicular and infraclavicular region, and a large left-sided  hemopneumothorax, as detailed above. 2.  No evidence of acute aortic injury or mediastinal hematoma. 3. Atherosclerosis, including left anterior descending and left circumflex coronary artery disease. Assessment for potential risk factor modification, dietary therapy or pharmacologic therapy may be warranted, if clinically indicated.  CT ABDOMEN AND PELVIS  Findings:  Abdomen/Pelvis: The enhanced appearance of the liver, gallbladder, pancreas, spleen, bilateral adrenal glands and left kidney is unremarkable.  There is parenchymal atrophy throughout the lower pole of the right kidney.  No ascites or pneumoperitoneum and no pathologic distension of bowel.  No definite pathologic lymphadenopathy identified within the abdomen or pelvis.  No abnormal abdominal, pelvic or retroperitoneal fluid collections to suggest significant hemorrhage.  Prostate calcifications.  Urinary bladder is unremarkable.  Musculoskeletal: There are no aggressive appearing lytic or blastic lesions noted in the visualized portions of the skeleton.  No acute displaced fractures in the visualized portions of the skeleton.  IMPRESSION:  1.  No evidence of significant acute traumatic injury to the abdomen  or pelvis. 2.  Atrophy of the inferior pole of the right kidney.  Original Report Authenticated By: Florencia Reasons, M.D.   Dg Chest Portable 1 View  03/30/2012  *RADIOLOGY REPORT*  Clinical Data: Status post chest tube placement.  PORTABLE CHEST - 1 VIEW  Comparison: Plain films CT chest earlier this same day.  Findings: The patient has a new left chest tube.  The left lung is nearly completely re-expanded.  Right lung remains clear.  Left clavicle and rib fractures again noted.  IMPRESSION: No complete re-expansion of the left lung after chest tube placement.  No new abnormality.  Original Report Authenticated By: Bernadene Bell. Maricela Curet, M.D.   Ct Maxillofacial Wo Cm  03/30/2012   *RADIOLOGY REPORT*  Clinical Data: 61 year old male status  post blunt trauma.  Left temporal bone fracture.  Subarachnoid hemorrhage.  Pneumocephalus.  CT MAXILLOFACIAL WITHOUT CONTRAST  Technique:  Multidetector CT imaging of the maxillofacial structures was performed. Multiplanar CT image reconstructions were also generated.  Comparison: Head and cervical spine CT from earlier the same day. Head CT without contrast 10/27/2011.  Findings: Trace pneumocephalus in the left middle cranial fossa laterally with adjacent small hyperdense extra-axial hemorrhage measuring up to 7 mm in thickness (by 8 x 15 mm, see coronal image 47).  Mild associated mass effect on the left temporal lobe.  Small volume of extra-axial hemorrhage elsewhere, greater on the right, favored to reflect subarachnoid hemorrhage.  Basilar cisterns are patent.  No definite midline shift.  No ventriculomegaly or intraventricular hemorrhage evident on these images.  No definite left orbital floor fracture.  Suspect nondisplaced anterior left maxillary sinus wall fracture with a small volume of hemorrhage and secretions within the right maxillary sinus.  No lamina papyracea fracture.  Mild paranasal sinus mucosal thickening elsewhere.  Mandible intact.  No additional facial fractures.  Right temporal bone:  Right EAC is within normal limits.  Right tympanic cavity and mastoids are clear.  No right temporal bone fracture identified.  Left temporal bone:  Comminuted left temporal bone fracture extends partially into the squamosa portion of the left sphenoid bone where it might traverse the foramen spinosum (series 5 image 60). The fracture plane also traverses the anterior margin of the left mastoids and the tympanic cavity at the level of the ossicles (series 5 image 64). Intermittent opacification of the left mastoids and tympanic cavity.  Ossicles appear to remain intact, aligned.  Left IAC, cochlea, vestibule, and semicircular canals are not definitely affected. The fracture extends cephalad in a linear  nondisplaced fashion, similar to that depicted in March.  .  IMPRESSION: 1.  Comminuted left temporal bone/lateral sphenoid bone fracture with associated extra-axial hemorrhage in the middle cranial fossa up to 7 mm in thickness (by 8 x 15 mm).  This could represent a small subdural or epidural hemorrhage, as the fracture may traverse the left foramen spinosum.  There is adjacent pneumocephalus. This and other salient findings discussed by phone with Dr. Benjiman Core on 03/30/2012 at 1625 hours. 2.  Left temporal bone component of the fracture traverses the anterior margin of the mastoids and the left tympanic cavity at the level of the ossicles.  No ossicular dislocation is evident. 3.  Stable visualized extra-axial hemorrhage elsewhere in the brain, favor majority subarachnoid. 4.  Suspect nondisplaced right maxillary sinus anterior wall fracture with small volume of hemorrhage within the sinus.  Original Report Authenticated By: Harley Hallmark, M.D.    Review of Systems  Constitutional: Negative.  Respiratory: Positive for shortness of breath.   Cardiovascular: Positive for chest pain.  Gastrointestinal: Negative.   Genitourinary: Negative.   Musculoskeletal: Positive for joint pain.  Skin: Negative.   Neurological: Positive for headaches.  Endo/Heme/Allergies: Negative.   Psychiatric/Behavioral: Negative.    Blood pressure 123/84, pulse 94, temperature 98 F (36.7 Quinn), temperature source Axillary, resp. rate 15, SpO2 98.00%. Physical Exam  Assessment/Plan: Left clavicle fracture with multiple ribs fractures, pneumothorax. CHI. Conservative for clavicle fracture. No floating shoulder injury. Sling, ice. ROM as tolerated. See Dr. Shelle Iron 3 wks OPT for xrays. Discussed with pt. Also tobacco cessation.  Raymond Quinn 03/30/2012, 6:36 PM

## 2012-03-31 ENCOUNTER — Inpatient Hospital Stay (HOSPITAL_COMMUNITY): Payer: BC Managed Care – PPO

## 2012-03-31 LAB — BASIC METABOLIC PANEL
BUN: 8 mg/dL (ref 6–23)
CO2: 27 mEq/L (ref 19–32)
Calcium: 9 mg/dL (ref 8.4–10.5)
Chloride: 103 mEq/L (ref 96–112)
Creatinine, Ser: 0.55 mg/dL (ref 0.50–1.35)
GFR calc Af Amer: >90 mL/min (ref >90)
GFR calc non Af Amer: >90 mL/min (ref >90)
Glucose, Bld: 113 mg/dL — ABNORMAL HIGH (ref 70–99)
Potassium: 4.2 mEq/L (ref 3.5–5.1)
Sodium: 141 mEq/L (ref 135–145)

## 2012-03-31 LAB — PROTIME-INR
INR: 1.04 (ref 0.00–1.49)
Prothrombin Time: 13.8 seconds (ref 11.6–15.2)

## 2012-03-31 LAB — CBC
HCT: 42.8 % (ref 39.0–52.0)
Hemoglobin: 14.9 g/dL (ref 13.0–17.0)
MCH: 35.6 pg — ABNORMAL HIGH (ref 26.0–34.0)
MCHC: 34.8 g/dL (ref 30.0–36.0)
MCV: 102.1 fL — ABNORMAL HIGH (ref 78.0–100.0)
Platelets: 125 10*3/uL — ABNORMAL LOW (ref 150–400)
RBC: 4.19 MIL/uL — ABNORMAL LOW (ref 4.22–5.81)
RDW: 12.9 % (ref 11.5–15.5)
WBC: 11.9 10*3/uL — ABNORMAL HIGH (ref 4.0–10.5)

## 2012-03-31 MED ORDER — HYDROMORPHONE HCL PF 1 MG/ML IJ SOLN
1.0000 mg | INTRAMUSCULAR | Status: DC | PRN
  Administered 2012-03-31 – 2012-04-01 (×2): 1 mg via INTRAVENOUS
  Filled 2012-03-31 (×2): qty 1

## 2012-03-31 MED ORDER — OXYCODONE-ACETAMINOPHEN 5-325 MG PO TABS
1.0000 | ORAL_TABLET | ORAL | Status: DC | PRN
  Administered 2012-03-31 – 2012-04-01 (×2): 2 via ORAL
  Filled 2012-03-31 (×2): qty 2

## 2012-03-31 NOTE — ED Provider Notes (Signed)
I saw and evaluated the patient, reviewed the resident's note and I agree with the findings and plan. Patient came to the ER after a fall down 3 stairs. Some confusion but also intoxicated. Head CT shows skull fracture and some intracranial hemorrhage. Chest x-ray showed pneumothorax rib fractures and clavicle fracture. Vitals remained stable. Patient be admitted to trauma surgery would consult neurosurgery.  Raymond Quinn. Rubin Payor, MD 03/31/12 [phone]

## 2012-03-31 NOTE — Progress Notes (Signed)
UR complete 

## 2012-03-31 NOTE — Progress Notes (Signed)
Orthopedic Tech Progress Note Patient Details:  Raymond Quinn 07/01/1951 [id number]  Ortho Devices Type of Ortho Device: Arm foam sling Ortho Device/Splint Location: left arm Ortho Device/Splint Interventions: Application   Lanika Colgate 03/31/2012, 3:20 PM

## 2012-03-31 NOTE — Progress Notes (Signed)
Trauma Service Note  Subjective: Patient complaining of severe pain in the posterior thoracic area.    Objective: Vital signs in last 24 hours: Temp:  [97.5 F (36.4 C)-98.7 F (37.1 C)] 98.7 F (37.1 C) (08/14 0800) Pulse Rate:  [86-96] 91  (08/14 0800) Resp:  [12-22] 18  (08/14 0800) BP: (116-159)/(77-96) 143/83 mmHg (08/14 0800) SpO2:  [93 %-100 %] 98 % (08/14 0800) Weight:  [69.3 kg (152 lb 12.5 oz)] 69.3 kg (152 lb 12.5 oz) (08/13 1800)    Intake/Output from previous day: 08/13 0701 - 08/14 0700 In: 1718.8 [I.V.:1718.8] Out: 1000 [Urine:800; Chest Tube:200] Intake/Output this shift: Total I/O In: 125 [I.V.:125] Out: -   General: No acute distress.  Lots of pain  Lungs: Decreased breath sounds on the left, no air leakage.  About 200cc output from left chest tube.  CXR shows no PTX.  Multiple displaced ribs.  Abd: Benign  Extremities: No problems  Neuro: Intact  Lab Results: CBC   Basename 03/31/12 0405 03/30/12 1209 03/30/12 1156  WBC 11.9* -- 12.3*  HGB 14.9 16.7 --  HCT 42.8 49.0 --  PLT 125* -- 129*   BMET  Basename 03/31/12 0405 03/30/12 1209  NA 141 140  K 4.2 3.4*  CL 103 104  CO2 27 --  GLUCOSE 113* 126*  BUN 8 <3*  CREATININE 0.55 1.10  CALCIUM 9.0 --   PT/INR  Basename 03/31/12 0405 03/30/12 1156  LABPROT 13.8 13.7  INR 1.04 1.03   ABG No results found for this basename: PHART:2,PCO2:2,PO2:2,HCO3:2 in the last 72 hours  Studies/Results: Dg Chest 2 View  03/30/2012  *RADIOLOGY REPORT*  Clinical Data: Status post fall.  Pain.  CHEST - 2 VIEW  Comparison: None.  Findings: The patient has a small left pneumothorax estimated at 10- 15%.  There is some left basilar atelectasis.  The right lung is clear.  No pleural effusion.  Heart size is normal.  There are displaced fractures of the left fourth, fifth and sixth ribs.  Left clavicle fracture appears nondisplaced. Heart size is normal.  IMPRESSION: Small left pneumothorax due to displaced  fractures of the left fourth through sixth ribs. Left clavicle fracture is also seen.  Critical Value/emergent results were called by telephone at the time of interpretation on 03/30/2012 at 1:45 p.m. to Dr. Rubin Payor, who verbally acknowledged these results.  Original Report Authenticated By: Bernadene Bell. Maricela Curet, M.D.   Dg Thoracic Spine 2 View  03/30/2012  *RADIOLOGY REPORT*  Clinical Data: Fall, pain.  THORACIC SPINE - 2 VIEW  Comparison: None.  Findings: Vertebral body height and alignment are maintained with mild convex right thoracic scoliosis noted.  The patient has left fourth through sixth rib fractures of the left clavicle fracture.  IMPRESSION: Left 4-6 rib fractures and left clavicle fracture.  Negative for spine fracture.  Original Report Authenticated By: Bernadene Bell. D'ALESSIO, M.D.   Dg Lumbar Spine 2-3 Views  03/30/2012  *RADIOLOGY REPORT*  Clinical Data: Status post fall.  LUMBAR SPINE - 2-3 VIEW  Comparison: None.  Findings: Vertebral body height and alignment are maintained. There is some anterior endplate spurring.  Facet arthropathy lower lumbar spine is noted.  Mild convex left scoliosis is seen.  IMPRESSION: No acute finding.  Original Report Authenticated By: Bernadene Bell. Maricela Curet, M.D.   Ct Head Wo Contrast  03/31/2012  *RADIOLOGY REPORT*  Clinical Data: Follow up head injury  CT HEAD WITHOUT CONTRAST  Technique:  Contiguous axial images were obtained from the base of  the skull through the vertex without contrast.  Comparison: CT 03/30/2012  Findings: Moderate subarachnoid hemorrhage on the right is similar. Small subdural hematoma on the right is present.  There is a parenchymal sub centimeter hemorrhage in the right frontal temporal cortex which is unchanged.  Small subdural hygroma right frontal region is slightly increased and now measures approximately 4 mm. 1 mm midline shift to the left is unchanged.  No acute ischemic infarct or mass.  Fracture of the left squamosa of  the  temporal bone again noted with a small amount of intracranial air at the fracture site.  Left parietal scalp contusion over the convexity.  Small subdural hematoma adjacent to the fracture.  This is unchanged.  Ventricle size is normal.  Air-fluid level in the right maxillary sinus is unchanged and could represent underlying orbital fracture.  IMPRESSION: Right-sided subarachnoid hemorrhage is similar.  Small hemorrhagic contusion in the right temporal frontal cortex is unchanged.  Right frontal subdural hygroma is slightly larger.  There is minimal midline shift to the left.  Left temporal bone fracture and small left-sided subdural hematoma are unchanged.  Original Report Authenticated By: Camelia Phenes, M.D.   Ct Head Wo Contrast  03/30/2012  **ADDENDUM** CREATED: 03/30/2012 16:11:05  Tiny amount of medial left apex extrapleural air associated with left hemothorax; please see chest CT dictation for additional findings.  **END ADDENDUM** SIGNED BY: Elsie Stain, M.D.   03/30/2012  *RADIOLOGY REPORT*  Clinical Data:  Fall, left scalp hematoma, initially unresponsive but improved. Left T attempt.  CT HEAD WITHOUT CONTRAST CT CERVICAL SPINE WITHOUT CONTRAST  Technique:  Multidetector CT imaging of the head and cervical spine was performed following the standard protocol without intravenous contrast.  Multiplanar CT image reconstructions of the cervical spine were also generated.  Comparison:  10/27/2011.  CT HEAD  Findings: Moderate sized left temporal and left parietal scalp hematoma.  There is a left temporal squama skull fracture which was identified previously which appears slightly wider on today's exam suggesting reinjury.  There is evidence for pneumocephalus in the left middle cranial fossa (image 15), and air  around the left temporalis muscle, and air within the carotid canal.  There is extensive blood  in the left external canal and left middle ear/mastoid suspicious for a  left temporal bone  fracture.  CT temporal bone recommended for further evaluation.  Mild atrophy is present.  There is subarachnoid blood most prominently seen in the right sylvian fissure, as well as a small posterior frontal opercular subcortical 6 x 8 mm shearing injury. This may be associated with a small right posterior frontal subdural collection (image 20) in the right suprasylvian region. A lesser amount of subarachnoid blood is suspected in the left middle cranial fossa laterally.  Mild atrophy is present.  No areas of cortical infarction are seen. Mild chronic microvascular ischemic changes suspected in the deep white matter.  An air-fluid level is present in the right maxillary sinus.  This is incompletely evaluated, but in the setting of trauma could represent a right orbital blowout injury.  CT maxillofacial could provide additional information.  Air is seen in both cavernous sinuses which could be iatrogenic from intravenous catheter placement.  Mild mucosal thickening noted in the ethmoid sinuses. Frontal sinuses clear.  Mild mucosal thickening in the sphenoid.  IMPRESSION: Left temporal squama fracture appears similar to the injury in March, but slightly wider and may have been reinjured with the current fall.  There is a moderate-sized  left temporal parietal scalp hematoma.  Bilateral right greater than left subarachnoid blood, most notably right sylvian fissure, with a small 6 x 8 mm shearing injury right posterior frontal operculum with a minimal extra-axial hematoma associated.  Air-fluid level right maxillary sinus could indicate a blowout injury.  Correlate clinically.  CT maxillofacial could be helpful in further evaluation.  Left middle cranial fossa pneumocephalus along with air in and around the carotid canal and left temporalis muscle likely representing temporal bone fracture.  There is evidence for blood in the middle ear/mastoid and external canal on the left.  CT temporal bone could provide additional  information.  CT CERVICAL SPINE  Findings: There is no visible cervical spine fracture or traumatic subluxation.  Disc space narrowing is noted at C5-6 and C6-7 with moderate uncinate spurring and osteophyte formation.  There is no prevertebral soft tissue swelling or intraspinal hematoma.  No neck masses are identified.  Suspect left pleural effusion but incompletely evaluated.  Trachea midline.  IMPRESSION: Cervical spondylosis as described.  No visible cervical spine fracture.  Suspect left pleural effusion, incompletely evaluated.  Critical Value/emergent results were called by telephone at the time of interpretation on 03/30/2012 at 1:20 p.m. to Dr. Rubin Payor, who verbally acknowledged these results.  Original Report Authenticated By: Elsie Stain, M.D.   Ct Chest W Contrast  03/30/2012  *RADIOLOGY REPORT*  Clinical Data:  History of trauma from a fall.  Left-sided chest pain.  CT CHEST, ABDOMEN AND PELVIS WITH CONTRAST  Technique:  Multidetector CT imaging of the chest, abdomen and pelvis was performed following the standard protocol during bolus administration of intravenous contrast.  Contrast: OMNIPAQUE IOHEXOL 300 MG/ML  SOLN  Comparison:  No priors.  CT CHEST  Findings:  Mediastinum: Heart size is normal. There is no significant pericardial fluid, thickening or pericardial calcification. There is atherosclerosis of the thoracic aorta, the great vessels of the mediastinum and the coronary arteries, including calcified atherosclerotic plaque in the left anterior descending and left circumflex coronary arteries. No acute abnormality of the thoracic aorta; specifically, no aneurysm or dissection.  No abnormal fluid collection within the mediastinum to suggest mediastinal hematoma. Esophagus is unremarkable in appearance. No pathologically enlarged mediastinal or hilar lymph nodes.  Lungs/Pleura: There is a large left-sided pneumothorax, and there is some dependent intermediate attenuation (up to 42  HU) fluid within the left hemithorax, compatible with hemothorax.  Partial atelectasis of the left lung.  Right lung is well-aerated.  Musculoskeletal: Nondisplaced fracture of the anterolateral aspect of the left second rib.  Multiple other fractures of the posterior and posterolateral aspect of the left 3rd through 10th ribs, some of which are segmental and mildly displaced (e.g., the posterolateral aspect of the left sixth rib). No frank flail chest segment is identified.  There is also a mildly displaced/comminuted fracture of the distal third of the left clavicle.  Adjacent to the left clavicular fracture there is extensive soft tissue stranding, There are no aggressive appearing lytic or blastic lesions noted in the visualized portions of the skeleton.  IMPRESSION:  1.  Extensive acute trauma to the left side of the thorax including left clavicular fracture, multiple fractures of the left ribs 2-10, soft tissue contusion/hemorrhage in the left supraclavicular and infraclavicular region, and a large left-sided hemopneumothorax, as detailed above. 2.  No evidence of acute aortic injury or mediastinal hematoma. 3. Atherosclerosis, including left anterior descending and left circumflex coronary artery disease. Assessment for potential risk factor modification, dietary therapy or  pharmacologic therapy may be warranted, if clinically indicated.  CT ABDOMEN AND PELVIS  Findings:  Abdomen/Pelvis: The enhanced appearance of the liver, gallbladder, pancreas, spleen, bilateral adrenal glands and left kidney is unremarkable.  There is parenchymal atrophy throughout the lower pole of the right kidney.  No ascites or pneumoperitoneum and no pathologic distension of bowel.  No definite pathologic lymphadenopathy identified within the abdomen or pelvis.  No abnormal abdominal, pelvic or retroperitoneal fluid collections to suggest significant hemorrhage.  Prostate calcifications.  Urinary bladder is unremarkable.   Musculoskeletal: There are no aggressive appearing lytic or blastic lesions noted in the visualized portions of the skeleton.  No acute displaced fractures in the visualized portions of the skeleton.  IMPRESSION:  1.  No evidence of significant acute traumatic injury to the abdomen or pelvis. 2.  Atrophy of the inferior pole of the right kidney.  Original Report Authenticated By: Florencia Reasons, M.D.   Ct Cervical Spine Wo Contrast  03/30/2012  **ADDENDUM** CREATED: 03/30/2012 16:11:05  Tiny amount of medial left apex extrapleural air associated with left hemothorax; please see chest CT dictation for additional findings.  **END ADDENDUM** SIGNED BY: Elsie Stain, M.D.   03/30/2012  *RADIOLOGY REPORT*  Clinical Data:  Fall, left scalp hematoma, initially unresponsive but improved. Left T attempt.  CT HEAD WITHOUT CONTRAST CT CERVICAL SPINE WITHOUT CONTRAST  Technique:  Multidetector CT imaging of the head and cervical spine was performed following the standard protocol without intravenous contrast.  Multiplanar CT image reconstructions of the cervical spine were also generated.  Comparison:  10/27/2011.  CT HEAD  Findings: Moderate sized left temporal and left parietal scalp hematoma.  There is a left temporal squama skull fracture which was identified previously which appears slightly wider on today's exam suggesting reinjury.  There is evidence for pneumocephalus in the left middle cranial fossa (image 15), and air  around the left temporalis muscle, and air within the carotid canal.  There is extensive blood  in the left external canal and left middle ear/mastoid suspicious for a  left temporal bone fracture.  CT temporal bone recommended for further evaluation.  Mild atrophy is present.  There is subarachnoid blood most prominently seen in the right sylvian fissure, as well as a small posterior frontal opercular subcortical 6 x 8 mm shearing injury. This may be associated with a small right posterior  frontal subdural collection (image 20) in the right suprasylvian region. A lesser amount of subarachnoid blood is suspected in the left middle cranial fossa laterally.  Mild atrophy is present.  No areas of cortical infarction are seen. Mild chronic microvascular ischemic changes suspected in the deep white matter.  An air-fluid level is present in the right maxillary sinus.  This is incompletely evaluated, but in the setting of trauma could represent a right orbital blowout injury.  CT maxillofacial could provide additional information.  Air is seen in both cavernous sinuses which could be iatrogenic from intravenous catheter placement.  Mild mucosal thickening noted in the ethmoid sinuses. Frontal sinuses clear.  Mild mucosal thickening in the sphenoid.  IMPRESSION: Left temporal squama fracture appears similar to the injury in March, but slightly wider and may have been reinjured with the current fall.  There is a moderate-sized left temporal parietal scalp hematoma.  Bilateral right greater than left subarachnoid blood, most notably right sylvian fissure, with a small 6 x 8 mm shearing injury right posterior frontal operculum with a minimal extra-axial hematoma associated.  Air-fluid level right  maxillary sinus could indicate a blowout injury.  Correlate clinically.  CT maxillofacial could be helpful in further evaluation.  Left middle cranial fossa pneumocephalus along with air in and around the carotid canal and left temporalis muscle likely representing temporal bone fracture.  There is evidence for blood in the middle ear/mastoid and external canal on the left.  CT temporal bone could provide additional information.  CT CERVICAL SPINE  Findings: There is no visible cervical spine fracture or traumatic subluxation.  Disc space narrowing is noted at C5-6 and C6-7 with moderate uncinate spurring and osteophyte formation.  There is no prevertebral soft tissue swelling or intraspinal hematoma.  No neck masses are  identified.  Suspect left pleural effusion but incompletely evaluated.  Trachea midline.  IMPRESSION: Cervical spondylosis as described.  No visible cervical spine fracture.  Suspect left pleural effusion, incompletely evaluated.  Critical Value/emergent results were called by telephone at the time of interpretation on 03/30/2012 at 1:20 p.m. to Dr. Rubin Payor, who verbally acknowledged these results.  Original Report Authenticated By: Elsie Stain, M.D.   Ct Abdomen Pelvis W Contrast  03/30/2012  *RADIOLOGY REPORT*  Clinical Data:  History of trauma from a fall.  Left-sided chest pain.  CT CHEST, ABDOMEN AND PELVIS WITH CONTRAST  Technique:  Multidetector CT imaging of the chest, abdomen and pelvis was performed following the standard protocol during bolus administration of intravenous contrast.  Contrast: OMNIPAQUE IOHEXOL 300 MG/ML  SOLN  Comparison:  No priors.  CT CHEST  Findings:  Mediastinum: Heart size is normal. There is no significant pericardial fluid, thickening or pericardial calcification. There is atherosclerosis of the thoracic aorta, the great vessels of the mediastinum and the coronary arteries, including calcified atherosclerotic plaque in the left anterior descending and left circumflex coronary arteries. No acute abnormality of the thoracic aorta; specifically, no aneurysm or dissection.  No abnormal fluid collection within the mediastinum to suggest mediastinal hematoma. Esophagus is unremarkable in appearance. No pathologically enlarged mediastinal or hilar lymph nodes.  Lungs/Pleura: There is a large left-sided pneumothorax, and there is some dependent intermediate attenuation (up to 42 HU) fluid within the left hemithorax, compatible with hemothorax.  Partial atelectasis of the left lung.  Right lung is well-aerated.  Musculoskeletal: Nondisplaced fracture of the anterolateral aspect of the left second rib.  Multiple other fractures of the posterior and posterolateral aspect of the  left 3rd through 10th ribs, some of which are segmental and mildly displaced (e.g., the posterolateral aspect of the left sixth rib). No frank flail chest segment is identified.  There is also a mildly displaced/comminuted fracture of the distal third of the left clavicle.  Adjacent to the left clavicular fracture there is extensive soft tissue stranding, There are no aggressive appearing lytic or blastic lesions noted in the visualized portions of the skeleton.  IMPRESSION:  1.  Extensive acute trauma to the left side of the thorax including left clavicular fracture, multiple fractures of the left ribs 2-10, soft tissue contusion/hemorrhage in the left supraclavicular and infraclavicular region, and a large left-sided hemopneumothorax, as detailed above. 2.  No evidence of acute aortic injury or mediastinal hematoma. 3. Atherosclerosis, including left anterior descending and left circumflex coronary artery disease. Assessment for potential risk factor modification, dietary therapy or pharmacologic therapy may be warranted, if clinically indicated.  CT ABDOMEN AND PELVIS  Findings:  Abdomen/Pelvis: The enhanced appearance of the liver, gallbladder, pancreas, spleen, bilateral adrenal glands and left kidney is unremarkable.  There is parenchymal atrophy throughout  the lower pole of the right kidney.  No ascites or pneumoperitoneum and no pathologic distension of bowel.  No definite pathologic lymphadenopathy identified within the abdomen or pelvis.  No abnormal abdominal, pelvic or retroperitoneal fluid collections to suggest significant hemorrhage.  Prostate calcifications.  Urinary bladder is unremarkable.  Musculoskeletal: There are no aggressive appearing lytic or blastic lesions noted in the visualized portions of the skeleton.  No acute displaced fractures in the visualized portions of the skeleton.  IMPRESSION:  1.  No evidence of significant acute traumatic injury to the abdomen or pelvis. 2.  Atrophy of the  inferior pole of the right kidney.  Original Report Authenticated By: Florencia Reasons, M.D.   Dg Chest Port 1 View  03/31/2012  *RADIOLOGY REPORT*  Clinical Data: Pneumothorax.  Rib fractures.  PORTABLE CHEST - 1 VIEW  Comparison: Chest x-ray 03/30/2012.  Findings: Linear opacities in the left mid lung and vague opacity in the left base are favored to represent areas of atelectasis. Sequelae of aspiration could have a similar appearance.  Linear opacities in right base are presumably atelectatic.  The pulmonary vasculature is normal.  Cardiomediastinal silhouette is within normal limits allowing for slight patient rotation to the left.  IMPRESSION: 1.  Extensive left-sided trauma to the bony thorax redemonstrated, as above, with increasing atelectasis in the left mid and lower lung, and at the right base. 2.  Left-sided chest tube is in position, and no significant residual pneumothorax is identified on today's examination.  Original Report Authenticated By: Florencia Reasons, M.D.   Dg Chest Portable 1 View  03/30/2012  *RADIOLOGY REPORT*  Clinical Data: Status post chest tube placement.  PORTABLE CHEST - 1 VIEW  Comparison: Plain films CT chest earlier this same day.  Findings: The patient has a new left chest tube.  The left lung is nearly completely re-expanded.  Right lung remains clear.  Left clavicle and rib fractures again noted.  IMPRESSION: No complete re-expansion of the left lung after chest tube placement.  No new abnormality.  Original Report Authenticated By: Bernadene Bell. Maricela Curet, M.D.   Ct Maxillofacial Wo Cm  03/30/2012   *RADIOLOGY REPORT*  Clinical Data: 61 year old male status post blunt trauma.  Left temporal bone fracture.  Subarachnoid hemorrhage.  Pneumocephalus.  CT MAXILLOFACIAL WITHOUT CONTRAST  Technique:  Multidetector CT imaging of the maxillofacial structures was performed. Multiplanar CT image reconstructions were also generated.  Comparison: Head and cervical spine CT  from earlier the same day. Head CT without contrast 10/27/2011.  Findings: Trace pneumocephalus in the left middle cranial fossa laterally with adjacent small hyperdense extra-axial hemorrhage measuring up to 7 mm in thickness (by 8 x 15 mm, see coronal image 47).  Mild associated mass effect on the left temporal lobe.  Small volume of extra-axial hemorrhage elsewhere, greater on the right, favored to reflect subarachnoid hemorrhage.  Basilar cisterns are patent.  No definite midline shift.  No ventriculomegaly or intraventricular hemorrhage evident on these images.  No definite left orbital floor fracture.  Suspect nondisplaced anterior left maxillary sinus wall fracture with a small volume of hemorrhage and secretions within the right maxillary sinus.  No lamina papyracea fracture.  Mild paranasal sinus mucosal thickening elsewhere.  Mandible intact.  No additional facial fractures.  Right temporal bone:  Right EAC is within normal limits.  Right tympanic cavity and mastoids are clear.  No right temporal bone fracture identified.  Left temporal bone:  Comminuted left temporal bone fracture extends partially into the  squamosa portion of the left sphenoid bone where it might traverse the foramen spinosum (series 5 image 60). The fracture plane also traverses the anterior margin of the left mastoids and the tympanic cavity at the level of the ossicles (series 5 image 64). Intermittent opacification of the left mastoids and tympanic cavity.  Ossicles appear to remain intact, aligned.  Left IAC, cochlea, vestibule, and semicircular canals are not definitely affected. The fracture extends cephalad in a linear nondisplaced fashion, similar to that depicted in March.  .  IMPRESSION: 1.  Comminuted left temporal bone/lateral sphenoid bone fracture with associated extra-axial hemorrhage in the middle cranial fossa up to 7 mm in thickness (by 8 x 15 mm).  This could represent a small subdural or epidural hemorrhage, as the  fracture may traverse the left foramen spinosum.  There is adjacent pneumocephalus. This and other salient findings discussed by phone with Dr. Benjiman Core on 03/30/2012 at 1625 hours. 2.  Left temporal bone component of the fracture traverses the anterior margin of the mastoids and the left tympanic cavity at the level of the ossicles.  No ossicular dislocation is evident. 3.  Stable visualized extra-axial hemorrhage elsewhere in the brain, favor majority subarachnoid. 4.  Suspect nondisplaced right maxillary sinus anterior wall fracture with small volume of hemorrhage within the sinus.  Original Report Authenticated By: Harley Hallmark, M.D.    Anti-infectives: Anti-infectives    None      Assessment/Plan: s/p  Advance diet Decrease IVFs PT Keep CT on suction for now Pain control  LOS: 1 day   Marta Lamas. Gae Bon, MD, FACS [phone] Trauma Surgeon 03/31/2012

## 2012-03-31 NOTE — Progress Notes (Signed)
Patient ID: Raymond Quinn, male   DOB: 1951/03/22, 61 y.o.   MRN: [id number] Patient remains awake, alert, conversant. No focal deficit. His CT head from this morning is basically unchanged. His major complaint is pain from rib fractures. From neuro status he can be released. He needs to avoid all anti inflammatory meds or any meds that might increase bleeding tendency. Will be happy to follow up after discharge.

## 2012-03-31 NOTE — Progress Notes (Signed)
Patient transferred to 6N 13. Dx: fall/ TBI. Alert and oriented. Left Chest tube set to 20 cm suction. IV fluids infusing. Family at bedside. Oriented to room. Call bell in reach. Informed to call for assistance when needing to get OOB. Verbalizes understanding.

## 2012-04-01 ENCOUNTER — Inpatient Hospital Stay (HOSPITAL_COMMUNITY): Payer: BC Managed Care – PPO

## 2012-04-01 MED ORDER — OXYCODONE HCL 5 MG PO TABS
10.0000 mg | ORAL_TABLET | ORAL | Status: DC | PRN
  Administered 2012-04-01 – 2012-04-02 (×3): 15 mg via ORAL
  Administered 2012-04-04 – 2012-04-05 (×5): 10 mg via ORAL
  Filled 2012-04-01 (×4): qty 2
  Filled 2012-04-01 (×2): qty 3
  Filled 2012-04-01: qty 2
  Filled 2012-04-01: qty 3
  Filled 2012-04-01: qty 2

## 2012-04-01 MED ORDER — TRAMADOL HCL 50 MG PO TABS
100.0000 mg | ORAL_TABLET | Freq: Four times a day (QID) | ORAL | Status: DC
  Administered 2012-04-01 – 2012-04-05 (×13): 100 mg via ORAL
  Filled 2012-04-01 (×19): qty 2

## 2012-04-01 MED ORDER — HYDROMORPHONE HCL PF 1 MG/ML IJ SOLN
0.5000 mg | INTRAMUSCULAR | Status: DC | PRN
  Filled 2012-04-01: qty 1

## 2012-04-01 NOTE — Progress Notes (Signed)
Patient ID: Raymond Quinn, male   DOB: [birth year], 61 y.o.   MRN: [id number]   LOS: 2 days   Subjective: C/o significant back pain, shoulder pain. Says mobility is very limited. Admits he has an EtOH problem.  Objective: Vital signs in last 24 hours: Temp:  [97.7 F (36.5 C)-98.2 F (36.8 C)] 97.9 F (36.6 C) (08/15 0529) Pulse Rate:  [85-99] 99  (08/15 0529) Resp:  [16-21] 17  (08/15 0529) BP: (134-163)/(85-100) 142/86 mmHg (08/15 0529) SpO2:  [92 %-98 %] 92 % (08/15 0529)    CT No air leak 3107ml/24h @420ml    PORTABLE CHEST - 1 VIEW  Comparison: Portable chest x-ray of 03/31/2012  Findings: Only a tiny left apical pneumothorax remains with left  chest tube present. Opacity is noted at the left lung base  possibly due to pulmonary contusion and/or atelectasis. Multiple  left rib fractures again are noted. The right lung is clear.  Heart size is stable.  IMPRESSION:  1. Tiny left apical pneumothorax with left chest tube remaining.  2. Opacity at the left lung base most consistent with atelectasis  and contusion.  3. Multiple left rib fractures again noted.  Original Report Authenticated By: Raymond Quinn, M.D.   General appearance: alert, no distress and diaphoretic Resp: clear to auscultation bilaterally Cardio: regular rate and rhythm GI: normal findings: bowel sounds normal and soft, non-tender Extremities: NVI   Assessment/Plan: Fall TBI w/SAH -- Stable Left clav fx -- Sling Mult right rib fxs w/PTX s/p CT -- Water seal EtOH abuse -- SW to do SBIRT, check on treatment programs. Tobacco use -- d/c Quinn with clav fx FEN -- Encourage oral pain meds. Add tramadol. VTE -- SCD's Dispo -- CT   Raymond Caldron, PA-C Pager: [phone] General Trauma PA Pager: [phone]   04/01/2012

## 2012-04-01 NOTE — Progress Notes (Signed)
Improving.  Water seal CT - output still may preclude removal for a day or two. Patient examined and I agree with the assessment and plan  Violeta Gelinas, MD, MPH, FACS Pager: [phone]  04/01/2012 3:11 PM

## 2012-04-01 NOTE — Evaluation (Signed)
Physical Therapy Evaluation Patient Details Name: Raymond Quinn MRN: [id number] DOB: 22-May-1951 Today's Date: 04/01/2012 Time: 4098-1191 PT Time Calculation (min): 24 min  PT Assessment / Plan / Recommendation Clinical Impression  Pt s/p fall with TBI, multiple rib fxs and clavicle fx. Pt with ETOH abuse with multiple falls recently. Pt will benefit from skilled PT in the acute care setting in order to maxmize functional mobility, balance and safety. Pt would make an excellent CIR candidate in order to return to PLOF.    PT Assessment  Patient needs continued PT services    Follow Up Recommendations  Inpatient Rehab;Supervision/Assistance - 24 hour    Barriers to Discharge Decreased caregiver support not safe environment    Equipment Recommendations  Defer to next venue    Recommendations for Other Services Rehab consult   Frequency Min 4X/week    Precautions / Restrictions Precautions Precautions: Fall Restrictions Weight Bearing Restrictions: No Other Position/Activity Restrictions: L shoulder in sling, chest tube         Mobility  Bed Mobility Bed Mobility: Rolling Right;Right Sidelying to Sit;Sitting - Scoot to [place] of Bed;Sit to Supine Rolling Right: 5: Supervision Right Sidelying to Sit: 4: Min assist Sitting - Scoot to Edge of Bed: 5: Supervision Sit to Supine: 4: Min assist Details for Bed Mobility Assistance: VC for proper sequencing for comfort and pain control. Pt used pillow bracing ribs. Assist through trunk. Transferred to the right as all fractures on left side Transfers Transfers: Sit to Stand;Stand to Sit Sit to Stand: 3: Mod assist;With upper extremity assist;From bed Stand to Sit: 3: Mod assist;With upper extremity assist;To bed Details for Transfer Assistance: Mod assist for control as pt with decreased balance upon standing Ambulation/Gait Ambulation/Gait Assistance: 2: Max assist Ambulation Distance (Feet): 2 Feet Assistive device:  None Ambulation/Gait Assistance Details: Attempted ambulation near bed, bed with uncoordinated movements forward and decreased balance requiring max assist for stability. Pt in pain and requesting to lay back down    Exercises     PT Diagnosis: Abnormality of gait;Acute pain  PT Problem List: Decreased activity tolerance;Decreased balance;Decreased mobility;Decreased coordination;Decreased knowledge of use of DME;Decreased safety awareness;Decreased knowledge of precautions;Pain PT Treatment Interventions: DME instruction;Gait training;Functional mobility training;Therapeutic activities;Therapeutic exercise;Balance training;Neuromuscular re-education;Cognitive remediation;Patient/family education   PT Goals Acute Rehab PT Goals PT Goal Formulation: With patient/family Time For Goal Achievement: 04/08/12 Potential to Achieve Goals: Fair Pt will go Supine/Side to Sit: with modified independence PT Goal: Supine/Side to Sit - Progress: Goal set today Pt will go Sit to Supine/Side: with modified independence PT Goal: Sit to Supine/Side - Progress: Goal set today Pt will go Sit to Stand: with supervision PT Goal: Sit to Stand - Progress: Goal set today Pt will go Stand to Sit: with supervision PT Goal: Stand to Sit - Progress: Goal set today Pt will Transfer Bed to Chair/Chair to Bed: with min assist PT Transfer Goal: Bed to Chair/Chair to Bed - Progress: Goal set today Pt will Ambulate: 51 - 150 feet;with min assist;with least restrictive assistive device PT Goal: Ambulate - Progress: Goal set today  Visit Information  Last PT Received On: 04/01/12 Assistance Needed: +2    Subjective Data  Patient Stated Goal: no goal stated   Prior Functioning  Home Living Lives With: Son Available Help at Discharge: Family;Friend(s);Available 24 hours/day Type of Home: House Home Access: Stairs to enter [employer] of Steps: 5 Entrance Stairs-Rails: Right Home Layout: Two  level Alternate Level Stairs-Number of Steps: 15 Alternate Level  Stairs-Rails: Left Bathroom Shower/Tub: Walk-in shower;Door [employer] Toilet: Standard Bathroom Accessibility: Yes How Accessible: Accessible via walker Home Adaptive Equipment: Built-in shower seat Additional Comments: possibly be able to stay downstairs. Separated from wife, lives with sons and friends Prior Function Level of Independence: Independent Able to Take Stairs?: Yes Driving: Yes Vocation: Full time employment Comments: owns Associate Professor: No difficulties Dominant Hand: Right    Cognition  Overall Cognitive Status: Impaired Area of Impairment: Awareness of deficits;Problem solving;Following commands;Safety/judgement;Memory Arousal/Alertness: Awake/alert Orientation Level: Appears intact for tasks assessed Behavior During Session: [facility] for tasks performed Memory Deficits: decreased recall of any past events including multiple falls Following Commands: Follows multi-step commands inconsistently Safety/Judgement: Decreased safety judgement for tasks assessed;Impulsive;Decreased awareness of need for assistance Awareness of Deficits: decreased awareness of extent of deficits or need for assistance Cognition - Other Comments: slow to process    Extremity/Trunk Assessment Right Lower Extremity Assessment RLE ROM/Strength/Tone: Within functional levels RLE Sensation: WFL - Light Touch Left Lower Extremity Assessment LLE ROM/Strength/Tone: Within functional levels LLE Sensation: WFL - Light Touch   Balance Balance Balance Assessed:  (see ambulation)  End of Session PT - End of Session Equipment Utilized During Treatment: Gait belt;Other (comment) (chest tube, right sling) Activity Tolerance: Patient limited by fatigue;Patient limited by pain Patient left: in bed;with call bell/phone within reach;with family/visitor present Nurse Communication: Mobility status;Patient requests  pain meds   Milana Kidney 04/01/2012, 5:15 PM  04/01/2012 Milana Kidney DPT PAGER: [phone] OFFICE: [phone]

## 2012-04-01 NOTE — Clinical Social Work Psychosocial (Signed)
Clinical Social Work Department BRIEF PSYCHOSOCIAL ASSESSMENT 04/01/2012  Patient:  Raymond Quinn, Raymond Quinn     Account Number:  [id number]     Admit date:  03/30/2012  Clinical Social Worker:  Pearson Forster  Date/Time:  04/01/2012 04:00 PM  Referred by:  Physician  Date Referred:  04/01/2012 Referred for  Substance Abuse   Other Referral:   Interview type:  Patient Other interview type:   Patient wife Raymond Quinn) who patient is seperated from spoke with CSW in the hall with patient permission.    PSYCHOSOCIAL DATA Living Status:  WITH ADULT CHILDREN Admitted from facility:   Level of care:   Primary support name:  Raymond Quinn, Raymond Quinn  [phone] Primary support relationship to patient:  SPOUSE Degree of support available:   Adequate    CURRENT CONCERNS Current Concerns  Substance Abuse   Other Concerns:   Possible placement needs    SOCIAL WORK ASSESSMENT / PLAN Clinical Social Worker met with patient and patient spouse at the bedside to offer support, discuss patient plans at discharge, and inquire about patient substance use. Patient with friends in the room and wife requesting to speak privately in the hall.  Patient provided permission and CSW stepped out with patient wife.  Patient wife states that patient currently lives at home with his two sons (28, 59) due to patient and patient wife being seperated since January.  Patient sons are addicted to perscription pain medication and heavy alcohol users as well.  Patient wife states that the house is like a fraternity with continuous young kids in and out with no structure.  Patient oldest son is in jail for a DUI with a broken leg and youngest son has two broken shoulders due to a fall after a seizure. Patient wife has moved out with their youngest daughter and the eldest daughter is married and lives in [place]. 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  Patient wife states that patient sits in the garage and drinks rum all day until he passes out.  Patient wife knows that patient is not eating, bathing, working, or doing anything to take care of himself other than drink.  Patient wife is saddened by the fact that patient is not motivated for change.  She says "this is not the man I married.  I don't even know who this man is."  Patient wife expresses patient abilities to strive for extremes in his life and this is just another extreme that patient sees himself "good at."    Clinical Social Worker asked patient friends to step out and CSW had a conversation with patient alone.  Patient states "I know I'm an alcoholic, but I'm not a drunk."  I don't drink and drive or go on the job drunk.  I only sip and I always mix it with orange juice or coke."  Patient is willing to admit that he has a problem but is so consumed with his current business and its "success" that he is not willing to address his issues around his alcohol consumption.  Patient understands the risks associated with continued use and continues to refuse any type of inpatient treatment.    Clinical Social Worker spoke with patient regarding options for inpatient/outpatient therapy - patient not interested at this time.  Patient wife is overly tearful about the situation, stating that she is going on vacation to [place] on Saturday and is worried  he will be in more trouble by the time she returns home.  Per PT/Speech patient may be a good candidate for inpatient rehab.  CSW to follow up with MD, patient, and patient family about this option. CSW available for emotional support as needed.   Assessment/plan status:  Psychosocial Support/Ongoing Assessment of Needs Other assessment/ plan:   Information/referral to community resources:   Visual merchandiser spoke with patient regarding inpatient/outpatient treatment options.  Patient states that he will think about it and CSW could provide  options to wife.  CSW to provide resources to patient wife prior to discharge.    PATIENTS/FAMILYS RESPONSE TO PLAN OF CARE: Patient alert and oriented x3.  Patient admits that he has an alcohol problem but it is not willing to seek help at this time.  Patient would benefit from inpatient alcohol treatment, however his physical limitations may be a barrier at this time.  Patient wife is completely overwhelmed with patient situation and is fearful for his life.  Patient does not seem to fully comprehend the consequences of continued use.  Patient wife appreciative for support and states "I'll wish for the miracle."

## 2012-04-01 NOTE — Evaluation (Signed)
Speech Language Pathology Evaluation Patient Details Name: Raymond Quinn MRN: [id number] DOB: November 21, 1950 Today's Date: 04/01/2012 Time: 7829-5621 SLP Time Calculation (min): 41 min  Problem List:  Patient Active Problem List  Diagnosis  . Traumatic brain injury  . Subarachnoid hemorrhage following injury  . Rib fractures  . Closed left clavicular fracture  . ETOH abuse   Past Medical History:  Past Medical History  Diagnosis Date  . No pertinent past medical history    Past Surgical History:  Past Surgical History  Procedure Date  . Knee arthroscopy    HPI:  61 yr old male who was brought to [facility] by EMS who had fallen at his home. He was a ground level fall per the son who was present in the house but did not actually see the fall.  The patients ETOH level was over 200.  He does not remember falling. Found to be with agonal respirations by EMS, aroused with BVM temporarily. GCS 14 (confusion with dates) on arrival, maintaining airway with normal vital signs.. Pt found to have left pneumothorax, traumatic brain injury with the following CT results: Left temporal squama fracture appears similar to the injury in March, but slightly wider and may have been reinjured with the current fall.  There is a moderate-sized left temporal parietal scalp hematoma.  Bilateral right greater than left subarachnoid blood, most notably right sylvian fissure, with a small 6 x 8 mm shearing injury right posterior frontal operculum with a minimal extra-axial hematoma associated.   Also pt with a history of subacute right parietal subarachnoid hemorrhage March, 2013. ED notes states pt arrived 12 days after fall due to complaints consistent with Bell's Palsy and hearing loss.    Assessment / Plan / Recommendation Clinical Impression  Pt presents with high level cognitive deficits including reasoning, integrating new information in a verbal or functional tasks. Emergent awareness of deficits also impaired with  mobility, attempting to ambulate to bathroom alone though he verbalized awareness of restrictions and chest tube. Deficits may be multifactorial, consider previous TBI, ETOH abuse, and new TBI. Would recommend CIR at d/c if physical deficits also significant. Otherwise recommend outpatient SLP to address high level cognition for increased independence at home and with job.     SLP Assessment  Patient needs continued Speech Lanaguage Pathology Services    Follow Up Recommendations       Frequency and Duration min 1 x/week  2 weeks   Pertinent Vitals/Pain NA   SLP Goals  SLP Goals SLP Goal #1: Pt will demonstrate emergent awareness of deficits during functional tasks with min verbal cues.  SLP Goal #2: Pt will complete high level functional problem solving task with min assist.   SLP Evaluation Prior Functioning  Cognitive/Linguistic Baseline: Baseline deficits Baseline deficit details: seems to have some poor judgement at baseline. 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 Since fall in March son reports pt has had balance problems and has been much more lethargic. Pt admits to drinking liquor frequently.  Type of Home: House Lives With: Spouse Available Help at Discharge: Family;Friend(s) Vocation: Full time employment   Cognition  Overall Cognitive Status: Impaired Arousal/Alertness: Awake/alert Orientation Level: Oriented X4 Attention: Alternating;Divided Alternating Attention: Appears intact Divided Attention: Appears intact Memory: Appears intact Awareness: Impaired Awareness Impairment: Anticipatory impairment;Emergent impairment Problem Solving: Impaired Problem Solving Impairment: Functional complex;Verbal complex Executive Function: Reasoning;Sequencing;Organizing;Decision Making;Initiating;Self Correcting Reasoning: Impaired Reasoning Impairment: Verbal complex;Functional complex Sequencing: Appears intact Organizing: Appears intact Decision  Making: Appears intact Initiating: Appears intact Self Correcting: Appears intact Safety/Judgment: Impaired    Comprehension  Auditory Comprehension Overall Auditory Comprehension: Appears within functional limits for tasks assessed    Expression Verbal Expression Overall Verbal Expression: Appears within functional limits for tasks assessed   Oral / Motor Oral Motor/Sensory Function Overall Oral Motor/Sensory Function: Impaired Lingual ROM: Reduced right Lingual Symmetry: Abnormal symmetry right Lingual Strength: Reduced Motor Speech Overall Motor Speech: Impaired Articulation: Impaired   GO     Kilynn Fitzsimmons, Riley Nearing 04/01/2012, 3:11 PM

## 2012-04-02 ENCOUNTER — Inpatient Hospital Stay (HOSPITAL_COMMUNITY): Payer: BC Managed Care – PPO

## 2012-04-02 DIAGNOSIS — S069X9A Unspecified intracranial injury with loss of consciousness of unspecified duration, initial encounter: Secondary | ICD-10-CM

## 2012-04-02 DIAGNOSIS — S065XAA Traumatic subdural hemorrhage with loss of consciousness status unknown, initial encounter: Secondary | ICD-10-CM

## 2012-04-02 DIAGNOSIS — I609 Nontraumatic subarachnoid hemorrhage, unspecified: Secondary | ICD-10-CM

## 2012-04-02 DIAGNOSIS — S065X9A Traumatic subdural hemorrhage with loss of consciousness of unspecified duration, initial encounter: Secondary | ICD-10-CM

## 2012-04-02 DIAGNOSIS — S069XAA Unspecified intracranial injury with loss of consciousness status unknown, initial encounter: Secondary | ICD-10-CM

## 2012-04-02 NOTE — Evaluation (Signed)
Occupational Therapy Evaluation Patient Details Name: Raymond Quinn MRN: [id number] DOB: [birth year] Today's Date: 04/02/2012 Time: 4098-1191 OT Time Calculation (min): 17 min  OT Assessment / Plan / Recommendation Clinical Impression  WM SAHAGUN is a 61 y.o. right-handed male with history of noted right parietal subarachnoid hemorrhage in March 2013 after a fall. Admitted 03/30/2012 after a fall while at home at ground level. The fall was unwitnessed. Noted alcohol level of 200. Cranial CT scan showed left temporal bone fracture appeared to be similar to an injury sustained in March as well as bilateral right greater than left subarachnoid blood with a small 6 by 8mm shearing injury right posterior frontal operculum and minimal shift to the left. Patient also sustained multiple rib fractures with small left pneumothorax as well as left clavicle fracture. Neurosurgery followup Dr. Gerlene Fee and advise conservative care. Orthopedic followup Dr. Shelle Iron for left clavicle fracture advise shoulder sling and range of motion as tolerated. A chest tube was placed for pneumothorax and monitored. Pt presents with decr balance, pain, decreased awareness of deficits and high level cognitive skills and overall decrease level of I with ADL. Pt will benefit from skilled OT in the acute setting followed by CIR to maximize I prior to d/c    OT Assessment  Patient needs continued OT Services    Follow Up Recommendations  Inpatient Rehab    Barriers to Discharge Decreased caregiver support if pt to stay with son and his friends. Perferable for pt to d/c home with wife, if pt agreeable  Equipment Recommendations  Defer to next venue    Recommendations for Other Services Rehab consult  Frequency  Min 2X/week    Precautions / Restrictions Precautions Precautions: Fall Restrictions Weight Bearing Restrictions: No Other Position/Activity Restrictions: Lt shoulder in sling for comfort, chest tube   Pertinent  Vitals/Pain Pt reports right clavicle pain but did not rate. Pt is premedicated and was repositioned in sling with pillow under arm to provide support.    ADL  Grooming: Simulated;Moderate assistance Where Assessed - Grooming: Supported standing Upper Body Dressing: Simulated;Moderate assistance Where Assessed - Upper Body Dressing: Supported sitting Lower Body Dressing: Moderate assistance;Simulated Where Assessed - Lower Body Dressing: Supported sit to Pharmacist, hospital: Performed;Minimal Dentist Method: Sit to Barista: Regular height toilet;Grab bars Toileting - Clothing Manipulation and Hygiene: Performed;Moderate assistance Where Assessed - Toileting Clothing Manipulation and Hygiene: Standing Equipment Used: Gait belt Transfers/Ambulation Related to ADLs: +2total A (pt=5-80%) with HHA ambulation. +2 needed for assist with chest tube. See PT notes for details on gait ADL Comments: Pt and family educated on sling donning/doffing as well as support under RUE for comfort.     OT Diagnosis: Generalized weakness;Cognitive deficits;Acute pain  OT Problem List: Decreased strength;Decreased range of motion;Decreased activity tolerance;Impaired balance (sitting and/or standing);Decreased coordination;Decreased cognition;Decreased safety awareness;Decreased knowledge of use of DME or AE;Cardiopulmonary status limiting activity;Decreased knowledge of precautions;Impaired UE functional use;Pain OT Treatment Interventions: Self-care/ADL training;DME and/or AE instruction;Therapeutic activities;Cognitive remediation/compensation;Patient/family education;Balance training   OT Goals Acute Rehab OT Goals OT Goal Formulation: With patient/family Time For Goal Achievement: 04/16/12 Potential to Achieve Goals: Good ADL Goals Pt Will Perform Grooming: with set-up;with supervision;Standing at sink;Sitting at sink ADL Goal: Grooming - Progress: Goal set  today Pt Will Perform Upper Body Bathing: with set-up;with supervision;Sitting at sink;Sitting, chair ADL Goal: Upper Body Bathing - Progress: Goal set today Pt Will Perform Lower Body Bathing: with min assist;Sit to stand from bed;Sit to stand  from chair ADL Goal: Lower Body Bathing - Progress: Goal set today Pt Will Perform Upper Body Dressing: with supervision;with set-up;Sitting, bed;Sitting, chair ADL Goal: Upper Body Dressing - Progress: Goal set today Pt Will Perform Lower Body Dressing: with min assist;Sit to stand from bed;Sit to stand from chair ADL Goal: Lower Body Dressing - Progress: Goal set today Pt Will Transfer to Toilet: with supervision;Ambulation;with DME ADL Goal: Toilet Transfer - Progress: Goal set today Pt Will Perform Toileting - Clothing Manipulation: with supervision;Standing ADL Goal: Toileting - Clothing Manipulation - Progress: Goal set today Pt Will Perform Toileting - Hygiene: Independently;Sitting on 3-in-1 or toilet ADL Goal: Toileting - Hygiene - Progress: Goal set today Additional ADL Goal #1: Pt will respond appropriately 100% of the time to one-step commands during functional activities. ADL Goal: Additional Goal #1 - Progress: Goal set today Additional ADL Goal #2: Pt will provide appropriate response to 3/3 safety scenarios with Min VC's. ADL Goal: Additional Goal #2 - Progress: Goal set today  Visit Information  Last OT Received On: 04/02/12 Assistance Needed: +2 PT/OT Co-Evaluation/Treatment: Yes    Subjective Data  Subjective: Why does my clavicle keep popping? Patient Stated Goal: Return home (with friends)   Prior Functioning  Vision/Perception  Home Living Lives With: Son Available Help at Discharge: Family;Friend(s);Available 24 hours/day Type of Home: House Home Access: Stairs to enter [employer] of Steps: 5 Entrance Stairs-Rails: Right Home Layout: Two level Alternate Level Stairs-Number of Steps: 15 Alternate Level  Stairs-Rails: Left Bathroom Shower/Tub: Walk-in shower;Door [employer] Toilet: Standard Bathroom Accessibility: Yes How Accessible: Accessible via walker Home Adaptive Equipment: Built-in shower seat Additional Comments: possibly be able to stay downstairs. 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 Will need to test moving saccades.   Cognition  Arousal/Alertness: Awake/alert Orientation Level: Appears intact for tasks assessed Behavior During Session: [facility] for tasks performed Following Commands: Follows one step commands inconsistently;Follows multi-step commands inconsistently Safety/Judgement: Decreased awareness of need for assistance Cognition - Other Comments: Pt unable to follow one step commands initially, but able to follow later in session. Unsure if this is related to attention? Impulsive at times    Extremity/Trunk Assessment Right Upper Extremity Assessment RUE ROM/Strength/Tone: [facility] for tasks assessed (but is generally weak) RUE Sensation: WFL - Light Touch RUE Coordination: WFL - gross/fine motor Left Upper Extremity Assessment LUE ROM/Strength/Tone: Unable to fully assess;Due to pain LUE Sensation: WFL - Light Touch LUE Coordination: Deficits (due to pain )   Mobility Transfers Sit to Stand: 4: Min assist;From toilet;With upper extremity assist Stand to Sit: 4: Min assist;To chair/3-in-1;With armrests Details for Transfer Assistance: VC for hand placement; assist to steady during sit to stand and for controlled descent to chair   Exercise    Balance    End of Session OT - End of Session Equipment Utilized During Treatment: Gait belt Activity Tolerance:  Patient tolerated treatment well Patient left: in chair;with family/visitor present Nurse Communication: Mobility status  GO     Brenner Visconti 04/02/2012, 3:27 PM

## 2012-04-02 NOTE — Clinical Social Work Note (Signed)
Clinical Social Worker met with patient and patient wife at bedside to continue support and discuss patient plans at discharge.  Patient states that he is willing to explore the option of [facility] and is awaiting a full consult with follow up from admissions coordinator.  Patient states that he would like for his wife to remain involved in patient care and decision making.  Patient remains not willing to address the concern of inpatient substance abuse treatment.    Clinical Social Worker continuing to follow patient for emotional support and discharge planning needs.  Macario Golds, [place] [phone]

## 2012-04-02 NOTE — Progress Notes (Signed)
Too much drainage to remove the tube.  Otherwise he is doing okay.  This patient has been seen and I agree with the findings and treatment plan.  Marta Lamas. Gae Bon, MD, FACS [phone] (pager) [phone] (direct pager) Trauma Surgeon

## 2012-04-02 NOTE — Progress Notes (Signed)
Patient ID: Raymond Quinn, male   DOB: Aug 11, 1951, 61 y.o.   MRN: [id number]   LOS: 3 days   Subjective: No new c/o   Objective: Vital signs in last 24 hours: Temp:  [97.7 F (36.5 C)-98.4 F (36.9 C)] 98.1 F (36.7 C) (08/16 0530) Pulse Rate:  [77-95] 86  (08/16 0530) Resp:  [16-20] 16  (08/16 0530) BP: (127-148)/(65-93) 133/85 mmHg (08/16 0530) SpO2:  [93 %-98 %] 94 % (08/16 0530)    CT No air leak 261ml/24h @710ml    Radiology  PORTABLE CHEST - 1 VIEW  Comparison: Portable chest x-ray of 04/01/2012  Findings: There is still a tiny left apical pneumothorax present  with left chest tube remaining. Multiple left rib fractures and  mid left clavicular fracture remain. The right lung is clear with  mild basilar atelectasis present. Cardiomegaly is stable.  IMPRESSION:  Little change in tiny left apical pneumothorax with left chest tube  remaining.  Original Report Authenticated By: Juline Patch, M.D.   General appearance: alert and no distress Resp: clear to auscultation bilaterally Cardio: regular rate and rhythm GI: normal findings: bowel sounds normal and soft, non-tender   Assessment/Plan: Fall  TBI w/SAH -- Stable  Left clav fx -- Sling  Mult right rib fxs w/PTX s/p CT -- Water seal. OP too high to remove. EtOH abuse -- SW to do SBIRT, check on treatment programs.  Tobacco use -- d/c patch with clav fx  FEN -- No issues. Will get CIR consult. VTE -- SCD's  Dispo -- CT    [name], PA-C Pager: [phone] General Trauma PA Pager: [phone]   04/02/2012

## 2012-04-02 NOTE — Progress Notes (Signed)
Physical Therapy Treatment Patient Details Name: Raymond Quinn MRN: [id number] DOB: 02-Dec-1950 Today's Date: 04/02/2012 Time: 4098-1191 PT Time Calculation (min): 18 min  PT Assessment / Plan / Recommendation Comments on Treatment Session  Pt with great improvements in motivation this session, willing to ambulate and perform all tasks asked. Pt with improvements in attention and following commands as well. Will attempt AD next session for improvements in safety and independence.    Follow Up Recommendations  Inpatient Rehab;Supervision/Assistance - 24 hour    Barriers to Discharge        Equipment Recommendations  Defer to next venue    Recommendations for Other Services Rehab consult  Frequency Min 4X/week   Plan Discharge plan remains appropriate;Frequency remains appropriate    Precautions / Restrictions Precautions Precautions: Fall Restrictions Weight Bearing Restrictions: No Other Position/Activity Restrictions: Lt shoulder in sling for comfort, chest tube   Pertinent Vitals/Pain Pt with no complaints of pain throughout session.     Mobility  Bed Mobility Bed Mobility: Not assessed Transfers Transfers: Sit to Stand;Stand to Sit Sit to Stand: 4: Min assist;From toilet;With upper extremity assist Stand to Sit: 4: Min assist;To chair/3-in-1;With armrests Details for Transfer Assistance: VC for hand placement; assist to steady during sit to stand and for controlled descent to chair Ambulation/Gait Ambulation/Gait Assistance: 3: Mod assist Ambulation Distance (Feet): 100 Feet Assistive device: 1 person hand held assist Ambulation/Gait Assistance Details: Pt with staggering gait, difficulty to achieve full dorsiflexion or posteriorly weight shift. Movements still uncoordinated although improved since last session. Will attempt AD(quad cane vs hemiwalker) next session to assess improvements in safety and stability. Gait Pattern: Ataxic;Scissoring;Narrow base of support;Trunk  flexed;Decreased dorsiflexion - left;Decreased dorsiflexion - right;Step-to pattern Gait velocity: decreased gait speed    Exercises     PT Diagnosis:    PT Problem List:   PT Treatment Interventions:     PT Goals Acute Rehab PT Goals PT Goal: Sit to Stand - Progress: Progressing toward goal PT Goal: Stand to Sit - Progress: Progressing toward goal PT Transfer Goal: Bed to Chair/Chair to Bed - Progress: Progressing toward goal PT Goal: Ambulate - Progress: Progressing toward goal  Visit Information  Last PT Received On: 04/02/12 Assistance Needed: +2 PT/OT Co-Evaluation/Treatment: Yes       Cognition  Overall Cognitive Status: Impaired Arousal/Alertness: Awake/alert Orientation Level: Appears intact for tasks assessed Behavior During Session: [facility] for tasks performed Following Commands: Follows one step commands inconsistently;Follows multi-step commands inconsistently Safety/Judgement: Decreased awareness of need for assistance Cognition - Other Comments: Pt unable to follow one step commands initially, but able to follow later in session. Unsure if this is related to attention? Impulsive at times       End of Session PT - End of Session Equipment Utilized During Treatment: Gait belt;Other (comment) (chest tube, left sling) Activity Tolerance: Patient tolerated treatment well Patient left: in chair;with call bell/phone within reach;with family/visitor present Nurse Communication: Mobility status     Milana Kidney 04/02/2012, 4:41 PM  04/02/2012 Milana Kidney DPT PAGER: [phone] OFFICE: [phone]

## 2012-04-02 NOTE — Consult Note (Signed)
Physical Medicine and Rehabilitation Consult Reason for Consult: TBI/multi-trauma Referring Physician: Trauma services   HPI: Raymond Quinn is a 61 y.o. right-handed male with history of noted right parietal subarachnoid hemorrhage in March 2013 after a fall. Admitted 03/30/2012 after a fall while at home at ground level. The fall was unwitnessed. Noted alcohol level of 200. Cranial CT scan showed left temporal bone fracture appeared to be similar to an injury sustained in March as well as bilateral right greater than left subarachnoid blood with a small 6 by 8mm shearing injury right posterior frontal operculum and minimal shift to the left. Patient also sustained multiple left rib fractures with small left pneumothorax as well as left clavicle fracture. Neurosurgery followup Dr. Gerlene Fee and advise conservative care. Orthopedic followup Dr. Shelle Iron for left clavicle fracture advise shoulder sling and range of motion as tolerated. A chest tube was placed for pneumothorax and monitored. Pain control with the use of Dilaudid as well as oxycodone. Speech therapy followup noting high-level cognitive deficits with reasoning as well as verbal or functional tasks. Patient also with awareness deficits. Physical therapy evaluation completed with recommendations of physical medicine rehabilitation consult to consider inpatient rehabilitation services  Patient still has chest tube in place. Pain with deep inspiration Review of Systems  HENT: Positive for hearing loss.   Eyes: Positive for blurred vision.  Musculoskeletal: Positive for myalgias and falls.  Neurological: Positive for dizziness.  All other systems reviewed and are negative.   Past Medical History  Diagnosis Date  . No pertinent past medical history    Past Surgical History  Procedure Date  . Knee arthroscopy    History reviewed. No pertinent family history. Social History:  reports that he has been smoking Cigars.  He does not have any  smokeless tobacco history on file. He reports that he drinks about 2.5 ounces of alcohol per week. He reports that he does not use illicit drugs. Allergies:  Allergies  Allergen Reactions  . Penicillins Swelling   No prescriptions prior to admission    Home: Home Living Lives With: Son Available Help at Discharge: Family;Friend(s);Available 24 hours/day Type of Home: House Home Access: Stairs to enter [employer] of Steps: 5 Entrance Stairs-Rails: Right Home Layout: Two level Alternate Level Stairs-Number of Steps: 15 Alternate Level Stairs-Rails: Left Bathroom Shower/Tub: Walk-in shower;Door [employer] Toilet: Standard Bathroom Accessibility: Yes How Accessible: Accessible via walker Home Adaptive Equipment: Built-in shower seat Additional Comments: possibly be able to stay downstairs. Separated from wife, lives with sons and friends  Functional History: Prior Function Able to Take Stairs?: Yes Driving: Yes Vocation: Full time employment Comments: owns [employer] Status:  Mobility: Bed Mobility Bed Mobility: Rolling Right;Right Sidelying to Sit;Sitting - Scoot to [employer] of Bed;Sit to Supine Rolling Right: 5: Supervision Right Sidelying to Sit: 4: Min assist Sitting - Scoot to Edge of Bed: 5: Supervision Sit to Supine: 4: Min assist Transfers Transfers: Sit to Stand;Stand to Sit Sit to Stand: 3: Mod assist;With upper extremity assist;From bed Stand to Sit: 3: Mod assist;With upper extremity assist;To bed Ambulation/Gait Ambulation/Gait Assistance: 2: Max assist Ambulation Distance (Feet): 2 Feet Assistive device: None Ambulation/Gait Assistance Details: Attempted ambulation near bed, bed with uncoordinated movements forward and decreased balance requiring max assist for stability. Pt in pain and requesting to lay back down    ADL:    Cognition: Cognition Overall Cognitive Status: Impaired Arousal/Alertness: Awake/alert Orientation  Level: Oriented X4 Attention: Alternating;Divided Alternating Attention: Appears intact Divided Attention: Appears intact  Memory: Appears intact Awareness: Impaired Awareness Impairment: Anticipatory impairment;Emergent impairment Problem Solving: Impaired Problem Solving Impairment: Functional complex;Verbal complex Executive Function: Reasoning;Sequencing;Organizing;Decision Making;Initiating;Self Correcting Reasoning: Impaired Reasoning Impairment: Verbal complex;Functional complex Sequencing: Appears intact Organizing: Appears intact Decision Making: Appears intact Initiating: Appears intact Self Correcting: Appears intact Safety/Judgment: Impaired Cognition Overall Cognitive Status: Impaired Area of Impairment: Awareness of deficits;Problem solving;Following commands;Safety/judgement;Memory Arousal/Alertness: Awake/alert Orientation Level: Appears intact for tasks assessed Behavior During Session: [facility] for tasks performed Memory Deficits: decreased recall of any past events including multiple falls Following Commands: Follows multi-step commands inconsistently Safety/Judgement: Decreased safety judgement for tasks assessed;Impulsive;Decreased awareness of need for assistance Awareness of Deficits: decreased awareness of extent of deficits or need for assistance Cognition - Other Comments: slow to process  Blood pressure 133/85, pulse 86, temperature 98.1 F (36.7 C), temperature source Oral, resp. rate 16, height 5\' 9"  (1.753 m), weight 69.3 kg (152 lb 12.5 oz), SpO2 94.00%. Physical Exam  Constitutional: He appears well-developed.  HENT:       Mild left facial droop  Eyes:       Pupils round and reactive to light  Neck: Neck supple. No thyromegaly present.  Cardiovascular: Normal rate and regular rhythm.   Pulmonary/Chest: Breath sounds normal. No respiratory distress. He has no wheezes.  Abdominal: Bowel sounds are normal. He exhibits no distension. There is no  tenderness.  Musculoskeletal: He exhibits no edema.  Neurological: He is alert.       Patient's oriented x3 To Person Pl. and date of birth. He could not recall his most recent fall. He was able to name all family members and follows three-step commands  Skin:       Left chest tube in place  Psychiatric:       He does exhibit some decreased awareness of his deficits feeling that he is ready to go home  motor strength is 5/5 in bilateral hip flexors knee extensors ankle dose flexors as well as the right deltoid bicep tricep and grip. Left shoulder not tested secondary to pain from clavicular fracture Left grip is 4/5. Sensation is normal in both upper and lower extremities.   No results found for this or any previous visit (from the past 24 hour(s)). Dg Chest Port 1 View  04/02/2012  *RADIOLOGY REPORT*  Clinical Data: Follow up of left pneumothorax  PORTABLE CHEST - 1 VIEW  Comparison: Portable chest x-ray of 04/01/2012  Findings: There is still a tiny left apical pneumothorax present with left chest tube remaining.  Multiple left rib fractures and mid left clavicular fracture remain.  The right lung is clear with mild basilar atelectasis present.  Cardiomegaly is stable.  IMPRESSION: Little change in tiny left apical pneumothorax with left chest tube remaining.  Original Report Authenticated By: Juline Patch, M.D.   Dg Chest Port 1 View  04/01/2012  *RADIOLOGY REPORT*  Clinical Data: Multiple left rib fractures, follow-up  PORTABLE CHEST - 1 VIEW  Comparison: Portable chest x-ray of 03/31/2012  Findings: Only a tiny left apical pneumothorax remains with left chest tube present.  Opacity is noted at the left lung base possibly due to pulmonary contusion and/or atelectasis.  Multiple left rib fractures again are noted.  The right lung is clear. Heart size is stable.  IMPRESSION:  1.  Tiny left apical pneumothorax with left chest tube remaining. 2.  Opacity at the left lung base most consistent with  atelectasis and contusion. 3.  Multiple left rib fractures again noted.  Original Report Authenticated  By: Juline Patch, M.D.    Assessment/Plan: Diagnosis: traumatic brain injury , clavicular fracture, rib fracture resulting from a fall 1. Does the need for close, 24 hr/day medical supervision in concert with the patient's rehab needs make it unreasonable for this patient to be served in a less intensive setting? Potentially 2. Co-Morbidities requiring supervision/potential complications: alcoholism, prior recent traumatic brain injury 3. Due to bowel management, safety, skin/wound care, medication administration, pain management and patient education, does the patient require 24 hr/day rehab nursing? Potentially 4. Does the patient require coordinated care of a physician, rehab nurse, PT (1-2 hrs/day, 5 days/week), OT (1-2 hrs/day, 5 days/week) and SLP (0.5-1 hrs/day, 5 days/week) to address physical and functional deficits in the context of the above medical diagnosis(es)? Yes Addressing deficits in the following areas: balance, endurance, locomotion, strength, transferring, bathing, dressing, toileting and cognition 5. Can the patient actively participate in an intensive therapy program of at least 3 hrs of therapy per day at least 5 days per week? Potentially 6. The potential for patient to make measurable gains while on inpatient rehab is good 7. Anticipated functional outcomes upon discharge from inpatient rehab are supervision mobility with PT, supervision ADLs with OT, improved memory, concentration, attention  with SLP. 8. Estimated rehab length of stay to reach the above functional goals is: 7-10 days 9. Does the patient have adequate social supports to accommodate these discharge functional goals? Potentially 10. Anticipated D/C setting: Home 11. Anticipated post D/C treatments: HH therapy 12. Overall Rehab/Functional Prognosis: excellent  RECOMMENDATIONS: This patient's condition is  appropriate for continued rehabilitative care in the following setting: the patient needs his chest tube out and reassessment by PT and rehabilitation admission nurse. If he no longer requires physical assistance after discontinuation of the chest tube he may be able to go home with home health and followup as an outpatient Patient has agreed to participate in recommended program. Yes Note that insurance prior authorization may be required for reimbursement for recommended care.  Comment:    04/02/2012

## 2012-04-02 NOTE — Progress Notes (Signed)
Once chest tube removed, reassessment with therapy of his functional improvement will need to be done.   This will determine most appropriate rehab venue between home with Raymond Quinn and care of son, or CIR admit. I discussed with patient, wife, and daughter at bedside and they are aware. I will follow up Monday if patient not discharged. Patient pushing for discharge home. Please call with any questions. [phone]

## 2012-04-03 ENCOUNTER — Inpatient Hospital Stay (HOSPITAL_COMMUNITY): Payer: BC Managed Care – PPO

## 2012-04-03 NOTE — Progress Notes (Signed)
Patient ID: DEANTAE SHACKLETON, male   DOB: November 05, 1950, 61 y.o.   MRN: [id number] Patient ID: WAYLYN TENBRINK, male   DOB: [birth year], 61 y.o.   MRN: [id number]   LOS: 4 days   Subjective: No new c/o, still questioning when he can go home.   Objective: Vital signs in last 24 hours: Temp:  [97.4 F (36.3 C)-98.6 F (37 C)] 98.6 F (37 C) (08/17 0545) Pulse Rate:  [82-99] 97  (08/17 0545) Resp:  [16-18] 16  (08/17 0545) BP: (123-134)/(71-91) 124/71 mmHg (08/17 0545) SpO2:  [92 %-96 %] 92 % (08/17 0545) Last BM Date: 03/30/12  CT No air leak 139ml/24h @910ml    Radiology  PORTABLE CHEST - 1 VIEW  Comparison: Portable chest x-ray of 04/01/2012  Findings: There is still a tiny left apical pneumothorax present  with left chest tube remaining. Multiple left rib fractures and  mid left clavicular fracture remain. The right lung is clear with  mild basilar atelectasis present. Cardiomegaly is stable.  IMPRESSION:  Little change in tiny left apical pneumothorax with left chest tube  remaining.  Original Report Authenticated By: Juline Patch, M.D.   General appearance: alert and no distress Resp: clear to auscultation bilaterally Cardio: regular rate and rhythm GI: normal findings: bowel sounds normal and soft, non-tender VSS, no labs this am, afebrile CXR report pending.   Assessment/Plan: Fall  TBI w/SAH -- Stable  Left clav fx -- Sling when ambulating Mult right rib fxs w/PTX s/p CT -- Water seal.  CT OP remains too high to remove. EtOH abuse -- SW to do SBIRT, check on treatment programs.  Tobacco use -- d/c patch with clav fx  FEN -- No issues. Will get CIR consult. VTE -- SCD's  Dispo -- continue CT   Blenda Mounts ACNP 04/03/12

## 2012-04-03 NOTE — Progress Notes (Signed)
Agree with above.   Leave chest tube.   Walk around and sit up in chair as much as possible.

## 2012-04-03 NOTE — Progress Notes (Signed)
Physical Therapy Treatment Patient Details Name: Raymond Quinn MRN: [id number] DOB: 1951-07-18 Today's Date: 04/03/2012 Time: 8295-6213 PT Time Calculation (min): 19 min  PT Assessment / Plan / Recommendation Comments on Treatment Session  Patient was sitting up in recliner upon entering. Daughter stated that staff assisted him to the restroom however patient did not call for assistance and ambulated back to recliner by himself. Patient and family educated on patient need for assistance with mobility. Patient verbalized understanding but I question his ability to recall. Daughter present throughout and agreeable.     Follow Up Recommendations  Inpatient Rehab;Supervision/Assistance - 24 hour    Barriers to Discharge        Equipment Recommendations  Defer to next venue    Recommendations for Other Services    Frequency Min 4X/week   Plan Discharge plan remains appropriate;Frequency remains appropriate    Precautions / Restrictions Precautions Precautions: Fall   Pertinent Vitals/Pain Complaints of rib pain    Mobility  Transfers Sit to Stand: 4: Min assist;With upper extremity assist;With armrests;From chair/3-in-1 Stand to Sit: 4: Min assist;With upper extremity assist;With armrests;To chair/3-in-1 Details for Transfer Assistance: Max cues for safety and assistance for balance. Cues for positioning prior to sitting Ambulation/Gait Ambulation/Gait Assistance: 3: Mod assist Ambulation Distance (Feet): 300 Feet Assistive device: None Ambulation/Gait Assistance Details: Patient requiring Mod A to correct LOB with turns. When having patient turn he tends to cross fit and lose balance laterally. Cues to ambulate within blocks on floor to improve his swaying laterally Gait Pattern: Scissoring;Ataxic;Narrow base of support;Left foot flat;Right foot flat    Exercises     PT Diagnosis:    PT Problem List:   PT Treatment Interventions:     PT Goals Acute Rehab PT Goals PT Goal:  Sit to Stand - Progress: Progressing toward goal PT Goal: Stand to Sit - Progress: Progressing toward goal PT Transfer Goal: Bed to Chair/Chair to Bed - Progress: Progressing toward goal PT Goal: Ambulate - Progress: Progressing toward goal  Visit Information  Last PT Received On: 04/03/12 Assistance Needed: +1    Subjective Data  Subjective: I am just glad to be up on my feet   Cognition  Overall Cognitive Status: Impaired Area of Impairment: Awareness of deficits;Problem solving;Safety/judgement;Memory Arousal/Alertness: Awake/alert Orientation Level: Appears intact for tasks assessed Behavior During Session: [facility] for tasks performed Following Commands: Follows one step commands inconsistently;Follows multi-step commands inconsistently    Balance     End of Session PT - End of Session Equipment Utilized During Treatment: Other (comment) (chest tube) Activity Tolerance: Patient tolerated treatment well Patient left: in chair;with call bell/phone within reach;with family/visitor present Nurse Communication: Mobility status   GP     Fredrich Birks 04/03/2012, 1:37 PM 04/03/2012 Fredrich Birks PTA [phone] pager [phone] office

## 2012-04-04 ENCOUNTER — Inpatient Hospital Stay (HOSPITAL_COMMUNITY): Payer: BC Managed Care – PPO

## 2012-04-04 MED ORDER — POLYETHYLENE GLYCOL 3350 17 G PO PACK
17.0000 g | PACK | Freq: Every day | ORAL | Status: DC
  Administered 2012-04-04 – 2012-04-05 (×2): 17 g via ORAL
  Filled 2012-04-04 (×2): qty 1

## 2012-04-04 NOTE — Progress Notes (Signed)
  Subjective: Complains of pain when getting up No BM Denies SOB  Objective: Vital signs in last 24 hours: Temp:  [97.2 F (36.2 C)-98.7 F (37.1 C)] 98.7 F (37.1 C) (08/17 2116) Pulse Rate:  [88-90] 88  (08/17 2116) Resp:  [18-20] 18  (08/17 2116) BP: (123-133)/(78-86) 123/81 mmHg (08/17 2116) SpO2:  [95 %-96 %] 96 % (08/17 2116) Last BM Date: 03/30/12  Intake/Output from previous day: 08/17 0701 - 08/18 0700 In: 240 [P.O.:240] Out: 350 [Urine:200; Chest Tube:150] Intake/Output this shift:    Comfortable Lungs with decrease BS left base.  No air leak Abdomen soft, NT  Lab Results:  No results found for this basename: WBC:2,HGB:2,HCT:2,PLT:2 in the last 72 hours BMET No results found for this basename: NA:2,K:2,CL:2,CO2:2,GLUCOSE:2,BUN:2,CREATININE:2,CALCIUM:2 in the last 72 hours PT/INR No results found for this basename: LABPROT:2,INR:2 in the last 72 hours ABG No results found for this basename: PHART:2,PCO2:2,PO2:2,HCO3:2 in the last 72 hours  Studies/Results: Dg Chest Port 1 View  04/03/2012  *RADIOLOGY REPORT*  Clinical Data: Chest tube.  Pneumothorax.  PORTABLE CHEST - 1 VIEW  Comparison: Chest 04/01/2012 and 04/02/2012.  Findings: Left chest tube remains in place.  Small left apical pneumothorax is unchanged.  Mild basilar atelectasis is noted. Heart size is normal. Left rib fractures again noted.  IMPRESSION: No interval change in a small left pneumothorax with a chest tube in place.  Original Report Authenticated By: Bernadene Bell. Maricela Curet, M.D.    Anti-infectives: Anti-infectives    None      Assessment/Plan: s/p * No surgery found *  Given output, continue chest tube.  Check CXR Stool softener Incentive spirometer  LOS: 5 days    Ysabella Babiarz A 04/04/2012

## 2012-04-05 ENCOUNTER — Inpatient Hospital Stay (HOSPITAL_COMMUNITY): Payer: BC Managed Care – PPO

## 2012-04-05 MED ORDER — ADULT MULTIVITAMIN W/MINERALS CH: 1.0000 | ORAL_TABLET | Freq: Every day | ORAL | Status: DC

## 2012-04-05 MED ORDER — TRAMADOL HCL 50 MG PO TABS: 100.0000 mg | ORAL_TABLET | Freq: Four times a day (QID) | ORAL | Status: AC

## 2012-04-05 MED ORDER — OXYCODONE HCL 10 MG PO TABS: 10.0000 mg | ORAL_TABLET | ORAL | Status: AC | PRN

## 2012-04-05 NOTE — Progress Notes (Signed)
Patient discharged to home with son.  Discharge teaching completed including follow up care, medications and sign and symptoms of infections.  Verbalizes understanding with no further questions.

## 2012-04-05 NOTE — Progress Notes (Signed)
This patient has been seen and I agree with the findings and treatment plan.  Skye Plamondon O. Melek Pownall, III, MD, FACS [phone] (pager) [phone] (direct pager) Trauma Surgeon  

## 2012-04-05 NOTE — Discharge Instructions (Signed)
Chest Tube  A chest tube involves the surgical placement of a hollow, flexible drainage tube into the chest. The purpose of the tube is to drain fluid or blood from the chest cavity or to remove air that has accumulated between the lung and the inside of the chest wall. There are many causes for these problems, but all can interfere with the ability of the lung to be able to expand completely and work efficiently. This can be a dangerous situation, and re-expansion of the lung may be critical.  The tube is inserted and placed between the ribs and into the space between the inner lining of the chest wall and the outer surface of the lung. This space is called the pleural cavity. Depending upon the type of problem being treated, the tube may be inserted through an incision in the front of the chest or off to the side of the chest.   The area where the tube will be inserted is numbed with a local anesthetic. Sometimes, medication for pain and sedation is also given directly into a vein. An incision is made in between the ribs, and a small opening is made through the inner lining of the chest wall. The chest tube is inserted through this opening and is connected to a box-like plastic container that contains sterile water. Suction is attached to the system for drainage. A stitch (suture) and tape keep the tube in place.   In certain people, the chest tube may be inserted using a minimally invasive technique guided by X-ray. Sometimes chest tubes are placed during major lung or heart surgery while the person is under general anesthesia.   LET YOUR CAREGIVER KNOW ABOUT:   · Allergies.  · Medications taken including herbs, eye drops, over-the-counter medications, and creams.  · Use of steroids (by mouth or creams).  · Possible pregnancy, if applicable.  · Previous problems with anesthetics or numbing medication.  · History of blood clots (thrombophlebitis).  · History of bleeding or blood problems.  · Previous  surgery.  · Other health problems, particularly those affecting your lungs and breathing.  RISKS AND COMPLICATIONS  · Bleeding.  · Injury to the lung.  · Malfunction usually due to leaking of air around the tube and inadequate suction.  · Infection.  · Allergic and other types of reactions to anesthetics and/or pain medications.  BEFORE THE PROCEDURE   Wash all of the skin in the area of the chest where the tube will be placed. Try to remove any loose, scaling skin. There is no other specific preparation necessary unless advised otherwise by your caregiver.  AFTER THE PROCEDURE   The chest tube usually stays in place until X-rays show that all the blood, fluid, or air has drained from the chest and the lung has fully re-expanded. When the chest tube is no longer needed, it can be easily removed. Most people do not need medications to sedate or numb them while the chest tube is removed. Antibiotics may be used to prevent or treat infection.   Most people stay in the hospital until the chest tube is removed. While the chest tube is in place, the nursing staff will carefully check for possible air leaks, breathing difficulties, and the need for additional oxygen. You will need to breathe deeply and cough often to help re-expand the lung, assist with drainage, and prevent fluids from collecting in the lungs.  HOME CARE INSTRUCTIONS   Some people will go home with a chest   tube in place and a drainage or suction device still attached. In these cases, you should:  · Keep the drainage device upright. If it falls over, stand it up again. It will continue to drain. The next time you are examined by a visiting nurse, tell him or her that the unit fell over.  · Keep the drainage device below chest level to help drainage by gravity. Make sure the unit is below chest level when you are sitting, standing, lying down, and walking.  · Keep 2 clamps nearby. If any tube gets disconnected, clamp the tubing closest to your body with  both clamps. Call your caregiver for directions. You may need to go to the Emergency Department if the problem cannot be solved.  · Check the tubing often to make sure it is not kinked. When the tubing is kinked, draining will not take place properly, and you may have some trouble breathing.  · You may shower with the chest tube. Cover the area where the chest tube comes out with a small plastic bag and tape it well. Bring the drainage device to the shower area and leave it outside the shower curtain or door. The unit should never be under the direct flow of water.  · You may want to shower just before the visiting nurse comes. The nurse can then check the site and apply a new dressing. You will have to talk to your visiting nurse to arrange your shower time with your nursing visit.  · Gentle exercise such as walking is important to help your breathing and circulation. You should talk to your caregiver about any other activity and exercise you want to do.  · Only take over-the-counter or prescription medicines for pain, discomfort, or fever as directed by your caregiver.  IF THE CHEST TUBE FALLS OUT:  · Do not panic.  · Open a package of petroleum jelly-coated gauze  · Open a package of dry, square gauze  · Cover the wound first with the petroleum jelly gauze and then cover that with the square gauze  · Tape the dry gauze in place  · After you have covered the site, call your caregiver for directions. He/she will decide if you need to be seen or go to the nearest Emergency Department.  SEEK MEDICAL CARE IF:   · Your incision is getting redder, has opened, or has discharge.  · You have drainage from the incision area.  · You develop red streaking of the skin that extends above or below the incision.  · Your drainage changes color or becomes red or bloody.  · Your pain is not controlled with the medicines prescribed.  SEEK IMMEDIATE MEDICAL CARE IF:   · You have a fever.  · You develop any new trouble with  breathing.  · You develop any chest pain, lightheadedness, unusual sweating, weakness, or you faint.  · Your connecting tubes become disconnected.  · Your chest tube falls out.  Document Released: 11/12/2006 Document Revised: 07/24/2011 Document Reviewed: 07/23/2007  ExitCare® Patient Information ©2012 [employer].

## 2012-04-05 NOTE — Progress Notes (Signed)
Minimal CT output.  What has come out is serous.  Will DC CT later this morning, likely to go home later this afternoon.  This patient has been seen and I agree with the findings and treatment plan.  Marta Lamas. Gae Bon, MD, FACS [phone] (pager) [phone] (direct pager) Trauma Surgeon

## 2012-04-05 NOTE — Discharge Summary (Signed)
Should be okay to go home.  CXR okay.  This patient has been seen and I agree with the findings and treatment plan.  Marta Lamas. Gae Bon, MD, FACS [phone] (pager) [phone] (direct pager) Trauma Surgeon

## 2012-04-05 NOTE — Progress Notes (Signed)
Speech Language Pathology Discharge Patient Details Name: Raymond Quinn MRN: [id number] DOB: Oct 16, 1950 Today's Date: 04/05/2012 Time: 4098-1191 SLP Time Calculation (min): 17 min  Patient discharged from SLP services secondary to d/c from hospital.  Please see latest therapy progress note for current level of functioning and progress toward goals.    Progress and discharge plan discussed with patient and/or caregiver: Patient/Caregiver agrees with plan  GO     Royce Macadamia 04/05/2012, 1:19 PM

## 2012-04-05 NOTE — Discharge Summary (Signed)
Physician Discharge Summary  Patient ID: Raymond Quinn MRN: [id number] DOB/AGE: February 10, 1951 61 y.o.  Admit date: 03/30/2012 Discharge date: 04/05/2012  Admitting Diagnosis: TBI (subarachnoid hemorrhage) Left clavicular fracture  Discharge Diagnosis Patient Active Problem List   Diagnosis Date Noted  . Traumatic brain injury 03/30/2012  . Subarachnoid hemorrhage following injury 03/30/2012  . Rib fractures 03/30/2012  . Closed left clavicular fracture 03/30/2012  . ETOH abuse 03/30/2012    Consultants Dr. Gerlene Fee Neurosurgery Dr. Shelle Iron Orthopedics  Procedures Left chest tube  Hospital Course:  61 yr old male who was brought to [facility] by EMS who had fallen at his home. It was a ground level fall per the son who was present in the house but did not actually see the fall. The patients ETOH level was over 200. He ddi not remember falling and was not very cooperative. He reported pain in his upper back and left shoulder. He admited to drinking a lot of alcohol.  He was found to have a subarachnoid hemorrhage, pneumothorax and left clavicular fracture.  A left chest tube was placed.  The patient was admitted and monitor.  He was placed on DT protocol.  Over the next several days the patient's mental status improved and his pneumo improved by CXR.  He had a closed clavicular fracture that did not need surgery per ortho but did need a sling when out of bed.  PT saw the patient as well and recommended home health PT for assistance with reconditioning the patient at home.  On 8/19, the patient's chest tube had <60cc's of drainage over 24 hours and therefore the CT was removed.  A repeat CXR was done which showed no pneumothorax therefore it was felt the patient could be discharged.    Medication List  As of 04/05/2012  3:58 PM   TAKE these medications         multivitamin with minerals Tabs   Take 1 tablet by mouth daily.      Oxycodone HCl 10 MG Tabs   Take 1-2 tablets (10-20 mg total) by  mouth every 4 (four) hours as needed (10mg  for mild pain, 15mg  for moderate pain, 20mg  for severe pain).      traMADol 50 MG tablet   Commonly known as: ULTRAM   Take 2 tablets (100 mg total) by mouth every 6 (six) hours.             Follow-up Information    Follow up with BEANE,JEFFREY C, MD. Schedule an appointment as soon as possible for a visit in 3 weeks.   Contact information:   [facility] [address] [place] [address] [phone]       Follow up with TRAUMA MD, MD in 3 weeks. (Call our office at [phone] to schedule your appointment to see [place] in 3 weeks.)          Signed: Clance Boll, [facility] Surgery [phone]  04/05/2012, 3:58 PM

## 2012-04-05 NOTE — Progress Notes (Signed)
Noted plans for d/c. Recommend follow up outpatient therapy at d/c. Please call for any questions. [phone]

## 2012-04-05 NOTE — Progress Notes (Signed)
UR updated.   HHPT arranged by pt choice with [facility].  Address and phone number in EPIC are correct according to the patient.

## 2012-04-05 NOTE — Progress Notes (Signed)
Physical Therapy Treatment Patient Details Name: Raymond Quinn MRN: [id number] DOB: 1951/03/07 Today's Date: 04/05/2012 Time: 7829-5621 PT Time Calculation (min): 18 min  PT Assessment / Plan / Recommendation Comments on Treatment Session  Patient planning on possible DC home today with his son. Son and Patient both educated on need for assistance at home and need for problem solving through task for safety purposes. Patient is still having difficulty understanding his safety concerns and need for assistance. Patients son stated that he is present all the time and understands patients need for assistance    Follow Up Recommendations  Home health PT;Supervision/Assistance - 24 hour    Barriers to Discharge        Equipment Recommendations  None recommended by PT    Recommendations for Other Services    Frequency Min 4X/week   Plan Discharge plan needs to be updated;Frequency remains appropriate    Precautions / Restrictions Precautions Precautions: Fall   Pertinent Vitals/Pain     Mobility  Bed Mobility Rolling Right: 6: Modified independent (Device/Increase time) Right Sidelying to Sit: 6: Modified independent (Device/Increase time) Transfers Sit to Stand: 5: Supervision;With upper extremity assist;From bed Stand to Sit: 5: Supervision;With upper extremity assist;To bed Details for Transfer Assistance: Cues for safety Ambulation/Gait Ambulation/Gait Assistance: 4: Min assist Ambulation Distance (Feet): 600 Feet Assistive device: None Ambulation/Gait Assistance Details: Patient able to ambulate with increase safety awareness and min A for occasional unsteadiness. Patient with increased balance from last session. No noted scissoring. Patient still with flat foot and narrow BOS.  Gait Pattern: Narrow base of support;Left foot flat;Right foot flat;Decreased stride length;Step-through pattern;Ataxic;Trunk rotated posteriorly on right;Decreased stance time - right Gait velocity:  varied throughout session    Exercises     PT Diagnosis:    PT Problem List:   PT Treatment Interventions:     PT Goals Acute Rehab PT Goals PT Goal: Supine/Side to Sit - Progress: Met PT Goal: Sit to Stand - Progress: Met PT Goal: Stand to Sit - Progress: Met PT Transfer Goal: Bed to Chair/Chair to Bed - Progress: Met PT Goal: Ambulate - Progress: Partly met  Visit Information  Last PT Received On: 04/05/12 Assistance Needed: +1    Subjective Data      Cognition  Overall Cognitive Status: Impaired Area of Impairment: Safety/judgement;Awareness of deficits Arousal/Alertness: Awake/alert Orientation Level: Appears intact for tasks assessed Behavior During Session: [facility] for tasks performed Safety/Judgement: Decreased awareness of safety precautions;Impulsive;Decreased safety judgement for tasks assessed    Balance     End of Session PT - End of Session Activity Tolerance: Patient tolerated treatment well Patient left: in bed;with call bell/phone within reach;with family/visitor present   GP     Cristino Degroff, Adline Potter 04/05/2012, 2:33 PM 04/05/2012 Fredrich Birks PTA [phone] pager [phone] office

## 2012-04-05 NOTE — Progress Notes (Signed)
Patient ID: Raymond Quinn, male   DOB: 1950/10/23, 61 y.o.   MRN: [id number] Procedure Note:  Chest tube removed, occlussive dressing applied, Rn Carol at bedside during procedure.  No complications, patient tolerated well.  CXR ordered for 1400.    Raymond Quinn 10:27 AM 04/05/2012

## 2012-04-05 NOTE — Progress Notes (Signed)
Speech Language Pathology Treatment Patient Details Name: Raymond Quinn MRN: [id number] DOB: 1951-05-13 Today's Date: 04/05/2012 Time: 4098-1191 SLP Time Calculation (min): 17 min  Assessment / Plan / Recommendation Clinical Impression  see skilled treatment section.    SLP Plan  Discharge SLP treatment due to (comment);Other (Comment) (d/c home later today.)       SLP Goals  SLP Goals SLP Goal #1 - Progress: Met SLP Goal #2 - Progress: Not met  General Temperature Spikes Noted: No Respiratory Status: Room air Behavior/Cognition: Alert;Cooperative;Pleasant mood Oral Cavity - Dentition: Adequate natural dentition Patient Positioning: Upright in bed  Oral Cavity - Oral Hygiene Brush patient's teeth BID with toothbrush (using toothpaste with fluoride): Yes   Treatment Treatment focused on: Cognition Skilled Treatment: Pt. seen for cognitive therapy.  Pt. is scheduled to be discharged home today with his son.  He required moderate verbal cues for redirection to current topic of conversation and anticipatory awareness/problem solving of home situations.  He needed mild verbal cues for working memory of information regarding hospital situations.  His decreased alternating and divided attention leads to increased difficulty with working and prospective memory. Discussed compensatory strategies he can utilize at home for memory, Set designer.  Spoke with pt.'s father and reiterated need for full supervision at home and during ADL's.   Breck Coons [place].Ed [employer] [phone]  04/05/2012

## 2012-04-05 NOTE — Progress Notes (Signed)
Patient ID: Raymond Quinn, male   DOB: [birth year], 61 y.o.   MRN: [id number]  LOS: 6 days   Subjective: Wants to go home, no new complaints, having left shoulder and shoulder blade pain, pain at chest tube site, no SOB, walking ok, reports son can help with all needs at home.  Objective: Vital signs in last 24 hours: Temp:  [97.9 F (36.6 C)-98.8 F (37.1 C)] 97.9 F (36.6 C) (08/19 0554) Pulse Rate:  [79-88] 88  (08/19 0554) Resp:  [18] 18  (08/19 0554) BP: (118-136)/(73-79) 118/77 mmHg (08/19 0554) SpO2:  [95 %-97 %] 95 % (08/19 0554) Last BM Date: 03/30/12  *RADIOLOGY REPORT*  Clinical Data: Chest tube. Pneumothorax.  PORTABLE CHEST - 1 VIEW  Comparison: 04/03/2012  Findings: There is probable small left basilar pneumothorax. Left  basilar atelectasis is present. Right lung contains minimal  atelectasis at the base.  IMPRESSION:  1. Small left basilar pneumothorax, less than 5% lung volume.  2. Bibasilar atelectasis, left greater than right.  Original Report Authenticated By: Patterson Hammersmith, M.D.   CT No air leak 50cc over last 24hrs  Physical Exam: General: no acute distress Resp: slightly coarse breath sounds on left but otherwise CTA bilateral Heart: RRR GI: soft, nontender, +bs Ext: extensive ecchymosis left shoulder/clavicular area.   Assessment/Plan: Fall  TBI w/SAH -- Stable, mental status good  Left clav fx -- Sling, ROM as tolerated. See Dr. Shelle Iron 3 wks OPT for xrays  Mult right rib fxs w/PTX s/p CT -- Water seal. No air leak, output now down to 50cc over last 24hrs, will have Dr. Lindie Spruce see first then d/c chest tube. EtOH abuse -- SW to do SBIRT, check on treatment programs.  Tobacco use -- pt has been offered cessation information  FEN -- No issues. Will get CIR consult. VTE -- SCD's  Dispo -- will see how patient does today after CT removed, he likely will be able to go home with [facility] and family assistance.      Mliss Wedin 8:03 AM  General  Trauma PA Pager: [phone]  04/05/2012

## 2012-04-07 ENCOUNTER — Telehealth (INDEPENDENT_AMBULATORY_CARE_PROVIDER_SITE_OTHER): Payer: Self-pay | Admitting: General Surgery

## 2012-04-07 NOTE — Telephone Encounter (Signed)
Johnny Bridge, Physical Therapist with [facility], calling for clarification of order for PT ROM exercises on clavicular fx.  Paged Dr. Lindie Spruce; stated to do as much as he can tolerate.  Therapist updated.

## 2012-04-08 ENCOUNTER — Telehealth (HOSPITAL_COMMUNITY): Payer: Self-pay | Admitting: Orthopedic Surgery

## 2012-04-08 NOTE — Telephone Encounter (Signed)
Tried multiple times between 12:45 and 2:00; number was constantly busy.

## 2012-04-08 NOTE — Telephone Encounter (Signed)
Busy again.

## 2012-04-09 ENCOUNTER — Telehealth (HOSPITAL_COMMUNITY): Payer: Self-pay | Admitting: Orthopedic Surgery

## 2012-04-09 ENCOUNTER — Telehealth: Payer: Self-pay | Admitting: Orthopedic Surgery

## 2012-04-09 MED ORDER — HYDROCODONE-ACETAMINOPHEN 10-325 MG PO TABS: 1.0000 | ORAL_TABLET | ORAL | Status: DC | PRN

## 2012-04-09 NOTE — Telephone Encounter (Signed)
Left message

## 2012-04-09 NOTE — Telephone Encounter (Signed)
Spoke with daughter about patient and explained what he and I spoke about yesterday. She thinks he may still need evaluation as pain may be worse but would check with him to see. I called in Norco 10/325 #20 and she will call me Monday if needed.

## 2012-04-13 ENCOUNTER — Telehealth (HOSPITAL_COMMUNITY): Payer: Self-pay | Admitting: Orthopedic Surgery

## 2012-04-13 MED ORDER — HYDROCODONE-ACETAMINOPHEN 10-325 MG PO TABS: 1.0000 | ORAL_TABLET | ORAL | Status: AC | PRN

## 2012-04-13 NOTE — Telephone Encounter (Signed)
Raymond Quinn called in for a refill on narcotic. Says he's been getting by mostly with 1 tablet although he sometimes needs 2. He is going to make an appointment with Dr. Shelle Iron today for his clavicle. I called in a rx for Norco 10/325, #60 with approval from [facility].

## 2012-05-03 ENCOUNTER — Telehealth: Payer: Self-pay | Admitting: Orthopedic Surgery

## 2012-05-03 NOTE — Telephone Encounter (Signed)
Patient called to say he was continuing to have severe left back pain, especially when he laid down. It would hurt some with coughing and sneezing. He described some intermittent SOB but didn't feel that was getting worse. I told him it was unusual to have that significant pain this far out but not unheard of. I offered a CT chest but the patient wanted to defer due to expense. I told him that if the pain or shortness of breath worsened or if it didn't improve in another 2-3 weeks that we should get one.

## 2015-07-30 ENCOUNTER — Emergency Department (HOSPITAL_COMMUNITY)
Admission: EM | Admit: 2015-07-30 | Discharge: 2015-07-30 | Disposition: A | Discharge status: Home / Self Care | Payer: 59 | Attending: Emergency Medicine | Admitting: Emergency Medicine

## 2015-07-30 ENCOUNTER — Encounter (HOSPITAL_COMMUNITY): Payer: Self-pay

## 2015-07-30 ENCOUNTER — Emergency Department (HOSPITAL_COMMUNITY): Payer: 59

## 2015-07-30 DIAGNOSIS — Y9289 Other specified places as the place of occurrence of the external cause: Secondary | ICD-10-CM | POA: Diagnosis not present

## 2015-07-30 DIAGNOSIS — Y9389 Activity, other specified: Secondary | ICD-10-CM | POA: Insufficient documentation

## 2015-07-30 DIAGNOSIS — S2241XA Multiple fractures of ribs, right side, initial encounter for closed fracture: Secondary | ICD-10-CM | POA: Diagnosis not present

## 2015-07-30 DIAGNOSIS — S2231XA Fracture of one rib, right side, initial encounter for closed fracture: Secondary | ICD-10-CM

## 2015-07-30 DIAGNOSIS — W19XXXA Unspecified fall, initial encounter: Secondary | ICD-10-CM

## 2015-07-30 DIAGNOSIS — S0990XA Unspecified injury of head, initial encounter: Secondary | ICD-10-CM | POA: Diagnosis not present

## 2015-07-30 DIAGNOSIS — J939 Pneumothorax, unspecified: Secondary | ICD-10-CM | POA: Diagnosis not present

## 2015-07-30 DIAGNOSIS — Z880 Allergy status to penicillin: Secondary | ICD-10-CM | POA: Insufficient documentation

## 2015-07-30 DIAGNOSIS — Y998 Other external cause status: Secondary | ICD-10-CM | POA: Diagnosis not present

## 2015-07-30 DIAGNOSIS — W01198A Fall on same level from slipping, tripping and stumbling with subsequent striking against other object, initial encounter: Secondary | ICD-10-CM | POA: Insufficient documentation

## 2015-07-30 DIAGNOSIS — F17210 Nicotine dependence, cigarettes, uncomplicated: Secondary | ICD-10-CM | POA: Insufficient documentation

## 2015-07-30 DIAGNOSIS — Z79899 Other long term (current) drug therapy: Secondary | ICD-10-CM | POA: Diagnosis not present

## 2015-07-30 DIAGNOSIS — S29001A Unspecified injury of muscle and tendon of front wall of thorax, initial encounter: Secondary | ICD-10-CM | POA: Diagnosis present

## 2015-07-30 MED ORDER — IBUPROFEN 800 MG PO TABS
800.0000 mg | ORAL_TABLET | Freq: Three times a day (TID) | ORAL | Status: DC
Start: 2015-07-30 — End: 2018-12-01

## 2015-07-30 MED ORDER — METHOCARBAMOL 500 MG PO TABS
500.0000 mg | ORAL_TABLET | Freq: Two times a day (BID) | ORAL | Status: DC
Start: 2015-07-30 — End: 2018-12-01

## 2015-07-30 MED ORDER — KETOROLAC TROMETHAMINE 60 MG/2ML IM SOLN: 30.0000 mg | Freq: Once | INTRAMUSCULAR | Status: DC

## 2015-07-30 MED ORDER — HYDROCODONE-ACETAMINOPHEN 5-325 MG PO TABS: 1.0000 | ORAL_TABLET | ORAL | Status: DC | PRN

## 2015-07-30 NOTE — ED Notes (Signed)
Pt with fall and rib injury on Tuesday night. States he was trying to get his pants on and missed pant leg.  Rib pain since then.

## 2015-07-30 NOTE — ED Notes (Signed)
Bed: RESB Expected date:  Expected time:  Means of arrival:  Comments: RM 27

## 2015-07-30 NOTE — Discharge Instructions (Signed)
You have been seen today for a rib fracture from a fall. The x-rays showed that you have 3 rib fractures and a small collapsed lung. Follow up with PCP as needed. Return to ED should symptoms worsen, specifically if you have increased shortness of breath, uncontrolled pain, or any other major concerns. You are been prescribed pain medicine and a muscle relaxer. Ibuprofen can also be used for pain. You should use the incentive spirometer as directed by the respiratory therapist 3-4 times a day. Emergency Department Resource Guide 1) Find a Doctor and Pay Out of Pocket Although you won't have to find out who is covered by your insurance plan, it is a good idea to ask around and get recommendations. You will then need to call the office and see if the doctor you have chosen will accept you as a new patient and what types of options they offer for patients who are self-pay. Some doctors offer discounts or will set up payment plans for their patients who do not have insurance, but you will need to ask so you aren't surprised when you get to your appointment.  2) Contact Your Local Health Department Not all health departments have doctors that can see patients for sick visits, but many do, so it is worth a call to see if yours does. If you don't know where your local health department is, you can check in your phone book. The CDC also has a tool to help you locate your state's health department, and many state websites also have listings of all of their local health departments.  3) Find a Walk-in Clinic If your illness is not likely to be very severe or complicated, you may want to try a walk in clinic. These are popping up all over the country in pharmacies, drugstores, and shopping centers. They're usually staffed by nurse practitioners or physician assistants that have been trained to treat common illnesses and complaints. They're usually fairly quick and inexpensive. However, if you have serious medical  issues or chronic medical problems, these are probably not your best option.  No Primary Care Doctor: - Call Health Connect at  [phone] - they can help you locate a primary care doctor that  accepts your insurance, provides certain services, etc. - Physician Referral Service- [phone]  Chronic Pain Problems: Organization         Address  Phone   Notes  [facility]  [phone] Patients need to be referred by their primary care doctor.   Medication Assistance: Organization         Address  Phone   Notes  [facility] [address] [place], [place] [address] [phone] --Must be a resident of [facility] -- Must have NO insurance coverage whatsoever (no Medicaid/ Medicare, etc.) -- The pt. MUST have a primary care doctor that directs their care regularly and follows them in the community   Med[facility]ist  [phone]   [employer]  [phone]    Agencies that provide inexpensive medical care: Organization         Address  Phone   Notes  [facility]  [phone]   [facility]    [phone]   [facility] [address] [place], [place] [address] [phone]   [facility] [address], [place] [phone]   [facility]    [phone]  [facility]    [phone]   [facility]  [address], [place] Phone:  [phone], Fax:  [phone] Hours of Operation:  9 am - 6 pm, M-F.  Also accepts Medicaid/Medicare and self-pay.  [facility] for [place] [address], [address], [place] Phone: [phone], Fax: [phone]. Hours of Operation:  8:30 am - 5:30 pm, M-F.  Also accepts Medicaid and self-pay.  [facility] [place] [address], [place] Phone: [phone]   [place], [place], [place]  [phone], Ext. [phone] Mondays & Thursdays: 7-9 AM.  First 15 patients are seen on a first come, first serve basis.    [place] Providers:  Organization         Address  Phone   Notes  [facility] [address], [place] [phone] Also accepts self-pay patients.  [facility] [address], [place]  [phone]   [place], [address], [place] [phone]   [facility] [address] [place] [phone]   Lucianne Lei [address], [place]   [phone] Only accepts [place] Access [place] patients after they have their name applied to their card.   Self-Pay (no insurance) in [facility]:  Organization         Address  Phone   Notes  Sickle Cell Patients, [facility] [place] [phone]   [facility] [place] [phone]   [facility] [place]  [place], [place], [place] [phone]   [facility]/Dr. Osei-Bonsu  [address] [place] or [address], [place] [phone] Phone number for both [place] and [place] locations is the same.  [facility] [address] [place] [phone]   [facility] [address] [place] or [address] [phone]   [facility] [address], [place] [phone], phone; [phone], fax Sees patients 1st and 3rd Saturday of every month.  Must not qualify for public or private insurance (i.e. Medicaid, Medicare, [place] Health Choice, Veterans' Benefits)  Household income should be no more than 200% of the poverty level The clinic cannot treat you if you are pregnant or think you are pregnant  Sexually transmitted  diseases are not treated at the clinic.    Dental Care: Organization         Address  Phone  Notes  [facility] [place] [phone] Accepts children up to age 15 who are enrolled in [place] or [place]; pregnant women with a Medicaid card; and children who have applied for Medicaid or [place] Health Choice, but were declined, whose parents can pay a reduced fee at time of service.  [facility]  [address], [place] [phone] Accepts children up to age 40 who are enrolled in [place] or [place]; pregnant women with a Medicaid card; and children who have applied for Medicaid or [place] Health Choice, but were declined, whose parents can pay a reduced fee at time  of service.  °[facility] ° [address], [place] [phone] Patients are seen by appointment only. Walk-ins are not accepted. [facility] will see patients 18 years of age and older. °Monday - Tuesday (8am-5pm) °Most Wednesdays (8:30-5pm) °$30 per visit, cash only  °[facility] ° [address], [place] [phone] Patients are seen by appointment only. Walk-ins are not accepted. [facility] will see patients 18 years of age and older. °One Wednesday Evening (Monthly: Volunteer Based).  $30 per visit, cash only  °[facility]  [phone] for adults; Children under age 4, call Graduate Pediatric Dentistry at [phone]. Children aged 4-14, please call [phone] to request a pediatric application. ° Dental services are provided in all areas of dental care including fillings, crowns and bridges, complete and partial dentures, implants, gum treatment, root canals, and extractions. Preventive care is also provided. Treatment is provided to both adults and children. °Patients are selected via a  lottery and there is often a waiting list. °  °[facility] [address], °[place] ° [phone] [email or web address] °  °[facility] [address], [place], [place] [phone], Ext. [phone] Second and Fourth Thursday of each month, opens at 6:30 AM; Clinic ends at 9 AM.  Patients are seen on a first-come first-served basis, and a limited number are seen during each clinic.  ° °[facility] ° [address], [place], [place] [phone]   Eligibility Requirements °You must have lived in [place], [place], or [place] counties for at least the last three months. °  You cannot be eligible for state or federal sponsored healthcare insurance, including Veterans Administration, Medicaid, or Medicare. °  You generally cannot be eligible for healthcare insurance through your employer.  °  How to apply: °Eligibility screenings are held every Tuesday and Wednesday afternoon from 1:00 pm until 4:00 pm. You do not need an appointment for the interview!  °[facility] [address], [place], [place] [phone]   °[facility]  [phone]   °[facility]  [phone]   °[facility]  [phone]   ° °Behavioral Health Resources in the Community: °Intensive Outpatient Programs °Organization         Address  Phone  Notes  °[facility] [address], [place], [place] [phone]   °[facility] [address], [place], [place] [phone]   °[facility] [address], [place], [place] ° [phone]   °[facility] [address],  °[place], [place] 1-800-853-5163 or [phone]   °Substance Abuse Resources °Organization         Address  Phone  Notes  °[facility]  336-882-2125   °[facility]  [phone]   °[facility]  336-285-9073   °[facility]  [phone]   °Residential &  Outpatient Substance Abuse Program  1-800-659-3381   °Psychological Services °Organization         Address  Phone  Notes  °[facility]  336- [phone]   °[facility]  336- [phone]   °[facility] [address], [place] 1-800-853-5163 or 336-641-4981   ° °Mobile Crisis Teams °Organization         Address  Phone  Notes  °[facility], Mobile Crisis Care Unit  1-877-626-1772   °Assertive °Psychotherapeutic   Services  [address] [place], [place]   [facility] [address], [place] [phone]    Self-Help/Support Groups Organization         Address  Phone             Notes  [place]. of [place] - variety of support groups  [place] Call for more information  Narcotics Anonymous (NA), [facility] [address], [employer] [place]  2 meetings at this location   Special educational needs teacher         Address  Phone  Notes  [facility] [place],    [place]  1-6361518779   [facility]  [address] [place] [address], [place], [place]   [place], [place] [phone] Admissions: 8am-3pm M-F  Incentives Substance [place] [address]    [place], [place] [phone]   [facility] [address] [place], [place], [place]   [facility] [address],  [place], [place]   [facility] - Intensive Outpatient [address], [place], [place]   [facility].,  [place], [place] [phone]-(302)[phone] or [phone]   [facility] ([facility]) [address] [place], [place] Accepts Medicaid  [facility] [place] [address],  [place] 1-7401063853 Substance Abuse/Addiction Treatment   [facility] Organization          Address  Phone  Notes  [facility]  6098293892   Domenic Schwab, PhD [address] [place], [place]   [phone] or [phone]   [place], [place] [phone]   [facility] [address], [place], [place] [phone] Insurance/Medicaid/sponsorship through [facility] [address], [address] [place]                                    [place], [place] [phone] [place] [address], [place] [phone]    Dr. Adele Schilder  [phone]   [facility]. 1) [address] [place] 2) [place] 3)  [place] [address], [place] [phone]  [phone]   [place] [phone] or [phone] (After Hours)

## 2015-07-30 NOTE — ED Notes (Signed)
Patient refused blood draw and iv. Patient stated that hes scared of needles and wants to go home. RN aware

## 2015-07-30 NOTE — ED Provider Notes (Signed)
CSN: [id number]     Arrival date & time 07/30/15  1411 History   First MD Initiated Contact with Patient 07/30/15 1502     Chief Complaint  Patient presents with  . Fall  . Rib Injury     (Consider location/radiation/quality/duration/timing/severity/associated sxs/prior Treatment) HPI   OSVALDO LAMPING is a 64 y.o. male, patient with no known past medical history, presenting to the ED with a fall 6 days ago with right posterior rib pain. Pt also has some shortness of breath. Pt rates his pain at 3/10 continuously, but rises to a 8/10 if he coughs or laughs, sharp in nature, non-radiating. Pt states he tripped and fell backward onto a hard floor. Pt states he hit his head, but denies LOC, N/V, confusion, headache, or visual changes. Pt denies chest pain, hemoptysis, dizziness, abdominal pain, or any other complaints. Pt states he has not been to a physician in 40 years. Pt has been taking ibuprofen with limited results. Pt denies anticoagulation therapy.     Past Medical History  Diagnosis Date  . No pertinent past medical history    Past Surgical History  Procedure Laterality Date  . Knee arthroscopy     History reviewed. No pertinent family history. Social History  Substance Use Topics  . Smoking status: Current Every Day Smoker -- 1.50 packs/day    Types: Cigars  . Smokeless tobacco: None  . Alcohol Use: 2.5 oz/week    5 drink(s) per week    Review of Systems  Constitutional: Negative for diaphoresis.  Respiratory: Positive for shortness of breath. Negative for cough, chest tightness, wheezing and stridor.   Cardiovascular: Negative for chest pain.  Gastrointestinal: Negative for nausea, vomiting and abdominal pain.  Genitourinary: Negative for dysuria and flank pain.  Musculoskeletal: Negative for back pain, neck pain and neck stiffness.       Posterior right rib pain  Skin: Negative for color change and pallor.  Neurological: Negative for dizziness, syncope, weakness,  light-headedness and headaches.  All other systems reviewed and are negative.     Allergies  Penicillins  Home Medications   Prior to Admission medications   Medication Sig Start Date End Date Taking? Authorizing Provider  HYDROcodone-acetaminophen (NORCO/VICODIN) 5-325 MG tablet Take 1-2 tablets by mouth every 4 (four) hours as needed. 07/30/15   Kamarii Buren C Harlym Gehling, PA-C  ibuprofen (ADVIL,MOTRIN) 800 MG tablet Take 1 tablet (800 mg total) by mouth 3 (three) times daily. 07/30/15   Keishaun Hazel C Jaquavious Mercer, PA-C  methocarbamol (ROBAXIN) 500 MG tablet Take 1 tablet (500 mg total) by mouth 2 (two) times daily. 07/30/15   Avyukth Bontempo C Aliviah Spain, PA-C  Multiple Vitamin (MULTIVITAMIN WITH MINERALS) TABS Take 1 tablet by mouth daily. 04/05/12   Elizabeth A White, PA-C   BP 156/82 mmHg  Pulse 91  Temp(Src) 98.1 F (36.7 C) (Oral)  Resp 17  SpO2 96% Physical Exam  Constitutional: He is oriented to person, place, and time. He appears well-developed and well-nourished. No distress.  HENT:  Head: Normocephalic and atraumatic.  Eyes: Conjunctivae and EOM are normal. Pupils are equal, round, and reactive to light.  Neck: Normal range of motion. Neck supple.  Cardiovascular: Normal rate, regular rhythm, normal heart sounds and intact distal pulses.   Pulmonary/Chest: No accessory muscle usage. No respiratory distress. He has decreased breath sounds in the right lower field.  Patient speaks in full sentences without notable dyspnea.  Abdominal: Soft. Bowel sounds are normal.  Musculoskeletal: He exhibits no edema or tenderness.  Full ROM in all extremities and spine. No paraspinal tenderness. Tenderness on posterior ribs 7-9. No crepitus or subcutaneous emphysema.   Neurological: He is alert and oriented to person, place, and time. He has normal reflexes.  No sensory deficits. Strength 5/5 in all extremities. No gait disturbance. Cranial nerves III-XII grossly intact.  Skin: Skin is warm and dry. He is not diaphoretic.   Nursing note and vitals reviewed.   ED Course  Procedures (including critical care time) Labs Review Labs Reviewed  BASIC METABOLIC PANEL  CBC WITH DIFFERENTIAL/PLATELET    Imaging Review Dg Ribs Unilateral W/chest Right  07/30/2015  CLINICAL DATA:  Larey SeatFell 1 week ago.  Right rib pain EXAM: RIGHT RIBS AND CHEST - 3+ VIEW COMPARISON:  04/05/2012 FINDINGS: Fractures of the right seventh eighth and ninth ribs with mild displacement. Small right pleural effusion with right lower lobe atelectasis. Tiny right apical pneumothorax. Chronic left rib fractures.  Left lung is clear. IMPRESSION: Fractures of the right seventh eighth and ninth ribs. Small right effusion and tiny right pneumothorax. Critical Value/emergent results were called by telephone at the time of interpretation on 07/30/2015 at 3:17 pm to [facility] DOWLESS , who verbally acknowledged these results. Electronically Signed   By: Marlan Palauharles  Clark M.D.   On: 07/30/2015 15:18   I have personally reviewed and evaluated these images and lab results as part of my medical decision-making.   EKG Interpretation None      MDM   Final diagnoses:  Fall, initial encounter  Right rib fracture, closed, initial encounter    Shelva MajesticGary T Fulcher presents with posterior right rib pain following a fall 6 days ago.  Findings and plan of care discussed with Lorre NickAnthony Allen, MD.  Patient noted to have fractures of the right seventh, eighth, and ninth ribs with a small right effusion and tiny right pneumothorax. Patient is not ill-appearing and is in no apparent distress. Patient has been maintaining at this level for almost a week. Patient speaks in full sentences without distress and has maintained an adequate SPO2 during his time in the ED. Patient will be discharged with instructions for when to return to the ED. Patient also advised that he would need to follow up with a PCP and was given the resource guide. Patient voiced understanding of these  instructions, agreed to the plan, and is comfortable with discharge.   Filed Vitals:   07/30/15 1449 07/30/15 1547  BP: 128/78 156/82  Pulse: 97 91  Temp: 98.1 F (36.7 C)   TempSrc: Oral   Resp: 18 17  SpO2: 97% 96%     Anselm PancoastShawn C Kayleanna Lorman, PA-C 07/30/15 1619  Lorre NickAnthony Allen, MD 07/30/15 [phone]

## 2015-08-02 ENCOUNTER — Other Ambulatory Visit (INDEPENDENT_AMBULATORY_CARE_PROVIDER_SITE_OTHER): Payer: Self-pay | Admitting: Internal Medicine

## 2015-08-03 ENCOUNTER — Encounter (HOSPITAL_COMMUNITY): Payer: Self-pay | Admitting: *Deleted

## 2015-08-03 ENCOUNTER — Emergency Department (HOSPITAL_COMMUNITY)
Admission: EM | Admit: 2015-08-03 | Discharge: 2015-08-03 | Disposition: A | Discharge status: Home / Self Care | Payer: 59 | Source: Home / Self Care | Attending: Family Medicine | Admitting: Family Medicine

## 2015-08-03 ENCOUNTER — Emergency Department (INDEPENDENT_AMBULATORY_CARE_PROVIDER_SITE_OTHER): Payer: 59

## 2015-08-03 DIAGNOSIS — S2231XD Fracture of one rib, right side, subsequent encounter for fracture with routine healing: Secondary | ICD-10-CM

## 2015-08-03 MED ORDER — TRAMADOL HCL 50 MG PO TABS: 50.0000 mg | ORAL_TABLET | Freq: Four times a day (QID) | ORAL | Status: DC | PRN

## 2015-08-03 NOTE — Discharge Instructions (Signed)
Activity as tolerated, use heat and medicine as needed.

## 2015-08-03 NOTE — ED Notes (Signed)
I signed into this patients chart to find out if he was referred to a orthopedic physicain for patient.Marland Kitchen..Marland Kitchen

## 2015-08-03 NOTE — ED Notes (Signed)
Pt  Reports   He  Was   Seen   In  The    Er   -       4  Days  Ago       He   Is  Here   For  A  Recheck      Pt  Reports          Some  Shortness  Of  Breath    Slightly         Worse  At  Night       he  Is  Sitting  Upright  On  The  Exam table  Speaking in  Complete  sentances

## 2015-08-03 NOTE — ED Provider Notes (Signed)
CSN: [id number]     Arrival date & time 08/03/15  1752 History   First MD Initiated Contact with Patient 08/03/15 1808     Chief Complaint  Patient presents with  . Follow-up   (Consider location/radiation/quality/duration/timing/severity/associated sxs/prior Treatment) Patient is a 64 y.o. male presenting with chest pain. The history is provided by the patient.  Chest Pain Pain location:  R lateral chest Pain quality: sharp   Pain radiates to:  Does not radiate Pain radiates to the back: no   Pain severity:  Mild Progression:  Improving Chronicity:  New Context comment:  Seen 12/12 in ER with 3 rib fx 6days after fall at home, here for f/up and doing better. Associated symptoms: no shortness of breath     Past Medical History  Diagnosis Date  . No pertinent past medical history    Past Surgical History  Procedure Laterality Date  . Knee arthroscopy     History reviewed. No pertinent family history. Social History  Substance Use Topics  . Smoking status: Current Every Day Smoker -- 1.50 packs/day    Types: Cigars  . Smokeless tobacco: None  . Alcohol Use: 2.5 oz/week    5 drink(s) per week    Review of Systems  Constitutional: Negative.   Respiratory: Negative for chest tightness and shortness of breath.   Cardiovascular: Positive for chest pain. Negative for leg swelling.  All other systems reviewed and are negative.   Allergies  Penicillins  Home Medications   Prior to Admission medications   Medication Sig Start Date End Date Taking? Authorizing Provider  HYDROcodone-acetaminophen (NORCO/VICODIN) 5-325 MG tablet Take 1-2 tablets by mouth every 4 (four) hours as needed. 07/30/15   Shawn C Joy, PA-C  ibuprofen (ADVIL,MOTRIN) 800 MG tablet Take 1 tablet (800 mg total) by mouth 3 (three) times daily. 07/30/15   Shawn C Joy, PA-C  methocarbamol (ROBAXIN) 500 MG tablet Take 1 tablet (500 mg total) by mouth 2 (two) times daily. 07/30/15   Shawn C Joy, PA-C   Multiple Vitamin (MULTIVITAMIN WITH MINERALS) TABS Take 1 tablet by mouth daily. 04/05/12   Elizabeth A White, PA-C  traMADol (ULTRAM) 50 MG tablet Take 1 tablet (50 mg total) by mouth every 6 (six) hours as needed for moderate pain. 08/03/15   Linna HoffJames D Golda Zavalza, MD   Meds Ordered and Administered this Visit  Medications - No data to display  BP 135/80 mmHg  Pulse 84  Temp(Src) 97.8 F (36.6 C) (Oral)  Resp 16  SpO2 96% No data found.   Physical Exam  Constitutional: He is oriented to person, place, and time. He appears well-developed and well-nourished. No distress.  Neck: Normal range of motion. Neck supple.  Cardiovascular: Regular rhythm, normal heart sounds and intact distal pulses.   Pulmonary/Chest: Effort normal and breath sounds normal. No respiratory distress. He has no rales.  Neurological: He is alert and oriented to person, place, and time.  Skin: Skin is warm and dry.  Nursing note and vitals reviewed.   ED Course  Procedures (including critical care time)  Labs Review Labs Reviewed - No data to display  Imaging Review Dg Chest 2 View  08/03/2015  CLINICAL DATA:  Followup examination following recent rib fractures EXAM: CHEST - 2 VIEW COMPARISON:  07/30/2015 FINDINGS: Cardiac shadow is stable. No pneumothorax is noted. The known rib fractures are again seen. A small effusion is again noted and stable. No new focal abnormality is seen. IMPRESSION: Stable appearance of the chest when  compared with the prior study. Electronically Signed   By: Alcide Clever M.D.   On: 08/03/2015 18:42   X-rays reviewed and report per radiologist.   Visual Acuity Review  Right Eye Distance:   Left Eye Distance:   Bilateral Distance:    Right Eye Near:   Left Eye Near:    Bilateral Near:         MDM   1. Rib fracture, right, with routine healing, subsequent encounter        Linna Hoff, MD 08/03/15 1857

## 2017-10-21 DIAGNOSIS — Z880 Allergy status to penicillin: Secondary | ICD-10-CM | POA: Diagnosis not present

## 2017-10-21 DIAGNOSIS — K08409 Partial loss of teeth, unspecified cause, unspecified class: Secondary | ICD-10-CM | POA: Diagnosis not present

## 2017-10-21 DIAGNOSIS — Z720 Tobacco use: Secondary | ICD-10-CM | POA: Diagnosis not present

## 2017-10-21 DIAGNOSIS — Z8249 Family history of ischemic heart disease and other diseases of the circulatory system: Secondary | ICD-10-CM | POA: Diagnosis not present

## 2017-10-21 DIAGNOSIS — H547 Unspecified visual loss: Secondary | ICD-10-CM | POA: Diagnosis not present

## 2017-10-21 DIAGNOSIS — R69 Illness, unspecified: Secondary | ICD-10-CM | POA: Diagnosis not present

## 2017-10-21 DIAGNOSIS — Z9181 History of falling: Secondary | ICD-10-CM | POA: Diagnosis not present

## 2018-01-14 DIAGNOSIS — S2242XA Multiple fractures of ribs, left side, initial encounter for closed fracture: Secondary | ICD-10-CM | POA: Diagnosis not present

## 2018-08-27 DIAGNOSIS — R69 Illness, unspecified: Secondary | ICD-10-CM | POA: Diagnosis not present

## 2018-08-27 DIAGNOSIS — R29898 Other symptoms and signs involving the musculoskeletal system: Secondary | ICD-10-CM | POA: Diagnosis not present

## 2018-08-27 DIAGNOSIS — R269 Unspecified abnormalities of gait and mobility: Secondary | ICD-10-CM | POA: Diagnosis not present

## 2018-08-27 DIAGNOSIS — Z79899 Other long term (current) drug therapy: Secondary | ICD-10-CM | POA: Diagnosis not present

## 2018-09-08 DIAGNOSIS — R69 Illness, unspecified: Secondary | ICD-10-CM | POA: Diagnosis not present

## 2018-09-08 DIAGNOSIS — R29898 Other symptoms and signs involving the musculoskeletal system: Secondary | ICD-10-CM | POA: Diagnosis not present

## 2018-09-09 ENCOUNTER — Other Ambulatory Visit: Payer: Self-pay | Admitting: Family Medicine

## 2018-09-09 DIAGNOSIS — R29898 Other symptoms and signs involving the musculoskeletal system: Secondary | ICD-10-CM

## 2018-09-13 DIAGNOSIS — G9389 Other specified disorders of brain: Secondary | ICD-10-CM | POA: Diagnosis not present

## 2018-10-28 ENCOUNTER — Encounter: Payer: Self-pay | Admitting: *Deleted

## 2018-11-01 ENCOUNTER — Ambulatory Visit: Payer: Medicare HMO | Admitting: Diagnostic Neuroimaging

## 2018-11-01 ENCOUNTER — Telehealth: Payer: Self-pay | Admitting: *Deleted

## 2018-11-01 ENCOUNTER — Encounter

## 2018-11-01 NOTE — Telephone Encounter (Signed)
LVM requesting patient call back to reschedule his new patient appointment from today. Advised him he would have gotten a voice mail yesterday advising him our office is closed today. We are preparing for patients tomorrow re: corona virus. I left office number and requested he call.

## 2018-11-25 ENCOUNTER — Telehealth: Payer: Self-pay | Admitting: *Deleted

## 2018-11-25 NOTE — Telephone Encounter (Signed)
Called pt and informed him that due to current COVID 19 pandemic, our office is severely reducing in person visits in order to minimize the risk to our patients and healthcare providers. We recommend to convert your appointment to a video visit.  He stated his son would have to help him. I gave him office number; he'll have his son call to discuss details.

## 2018-12-01 ENCOUNTER — Encounter: Payer: Self-pay | Admitting: *Deleted

## 2018-12-01 NOTE — Addendum Note (Signed)
Addended by: Colen Darling C on: 12/01/2018 11:50 AM   Modules accepted: Orders

## 2018-12-01 NOTE — Telephone Encounter (Addendum)
Called patient to reschedule VV because office is closed on Fridays. He asked I speak with his son, Raymond Quinn, and we rescheduled for Monday.  Raymond Quinn has downloaded program on computer. I advised will schedule Webex and send him e mail.  He had no questions; I spoke with patient and updated EMR.  Patient had no questions, verbalized understanding, appreciation. Webex scheduled; e mail sent.

## 2018-12-01 NOTE — Telephone Encounter (Signed)
LVM requesting call back to discuss video visit.

## 2018-12-01 NOTE — Telephone Encounter (Signed)
Pts son called in and we have downloaded [employer] ex   email- [email or web address]  Consent given and time confirmed

## 2018-12-01 NOTE — Telephone Encounter (Signed)
E mail correction: [email or web address] Webex e mail updated and resent to corrected e mail.

## 2018-12-06 ENCOUNTER — Ambulatory Visit (INDEPENDENT_AMBULATORY_CARE_PROVIDER_SITE_OTHER): Payer: Medicare HMO | Admitting: Diagnostic Neuroimaging

## 2018-12-06 ENCOUNTER — Encounter: Payer: Self-pay | Admitting: Diagnostic Neuroimaging

## 2018-12-06 ENCOUNTER — Other Ambulatory Visit: Payer: Self-pay

## 2018-12-06 DIAGNOSIS — R269 Unspecified abnormalities of gait and mobility: Secondary | ICD-10-CM | POA: Diagnosis not present

## 2018-12-06 DIAGNOSIS — F1029 Alcohol dependence with unspecified alcohol-induced disorder: Secondary | ICD-10-CM | POA: Diagnosis not present

## 2018-12-06 DIAGNOSIS — R69 Illness, unspecified: Secondary | ICD-10-CM | POA: Diagnosis not present

## 2018-12-06 NOTE — Progress Notes (Signed)
[facility]  PATIENT: Raymond Quinn DOB: Dec 06, 1950  REFERRING CLINICIAN: D Koirala HISTORY FROM: patient  REASON FOR VISIT: new consult    HISTORICAL  CHIEF COMPLAINT:  Chief Complaint  Patient presents with   Gait Problem    HISTORY OF PRESENT ILLNESS:   68 year old male here for evaluation of gait and balance difficulty.  Patient has history of gait and balance difficulty since 2013.  He was having multiple falls at that time, possibly related to acute alcohol intoxication.  Patient went to the emergency room multiple times.  He was admitted in August 2013 for a severe injury, trauma, multiple fractures and intoxication.  He was diagnosed with traumatic subarachnoid hemorrhage as well.  Patient had another emergency room evaluation in 2016 for falls and rib fractures.  Over the past couple of years he is continued to have worsening gait and balance difficulty.  He has continued to have multiple falls.  Patient has cut down alcohol intake, previously drinking hard liquor multiple drinks per day.  However in 2013 he changed to beer and wine, averaging 4-6 drinks per day.  Patient has first drink just before lunchtime and then has additional drinks every few hours until the evening.    REVIEW OF SYSTEMS: Full 14 system review of systems performed and negative with exception of: As per HPI.  ALLERGIES: Allergies  Allergen Reactions   Penicillins Swelling    HOME MEDICATIONS: No outpatient medications prior to visit.   No facility-administered medications prior to visit.     PAST MEDICAL HISTORY: Past Medical History:  Diagnosis Date   Frequent falls    No pertinent past medical history    Pneumothorax 2013   hx of    SAH (subarachnoid hemorrhage) (HCC) 2013   s/p fall    PAST SURGICAL HISTORY: Past Surgical History:  Procedure Laterality Date   KNEE ARTHROSCOPY Left     FAMILY HISTORY: No family history on file.  SOCIAL  HISTORY: Social History   Socioeconomic History   Marital status: Married    Spouse name: Not on file   Number of children: 4   Years of education: Not on file   Highest education level: Bachelor's degree (e.g., BA, AB, BS)  Occupational History    Comment: retired  Ecologist strain: Not on file   Food insecurity:    Worry: Not on file    Inability: Not on file   Transportation needs:    Medical: Not on file    Non-medical: Not on file  Tobacco Use   Smoking status: Current Every Day Smoker    Packs/day: 1.50    Types: Cigars   Smokeless tobacco: Never Used  Substance and Sexual Activity   Alcohol use: Yes    Alcohol/week: 5.0 standard drinks    Types: 5 Standard drinks or equivalent per week    Comment: 3 beers, 3 wine    Drug use: No   Sexual activity: Not on file  Lifestyle   Physical activity:    Days per week: Not on file    Minutes per session: Not on file   Stress: Not on file  Relationships   Social connections:    Talks on phone: Not on file    Gets together: Not on file    Attends religious service: Not on file    Active member of club or organization: Not on file    Attends meetings of clubs or organizations: Not on  file    Relationship status: Not on file   Intimate partner violence:    Fear of current or ex partner: Not on file    Emotionally abused: Not on file    Physically abused: Not on file    Forced sexual activity: Not on file  Other Topics Concern   Not on file  Social History Narrative   Lives with son   Caffeine- coffee 1 cup     PHYSICAL EXAM   GENERAL EXAM/CONSTITUTIONAL:  Vitals: There were no vitals filed for this visit.  There is no height or weight on file to calculate BMI. Wt Readings from Last 3 Encounters:  03/30/12 152 lb 12.5 oz (69.3 kg)  10/27/11 152 lb (68.9 kg)  10/21/11 150 lb (68 kg)     Patient is in no distress; well developed, nourished and groomed; neck is  supple  PARTIALLY EDENTULOUS  MILD DYSARTHRIA  NEUROLOGIC: MENTAL STATUS:  No flowsheet data found.  awake, alert, oriented to person, place and time  recent and remote memory intact  normal attention and concentration  language fluent, comprehension intact, naming intact  fund of knowledge appropriate  CRANIAL NERVE:   2nd, 3rd, 4th, 6th - visual fields full to confrontation, extraocular muscles intact, no nystagmus  5th - facial sensation symmetric  7th - facial strength symmetric  8th - hearing intact  11th - shoulder shrug symmetric  12th - tongue protrusion midline  MOTOR:   NO TREMOR; NO DRIFT  COORDINATION:   fine finger movements normal     DIAGNOSTIC DATA (LABS, IMAGING, TESTING) - I reviewed patient records, labs, notes, testing and imaging myself where available.  Lab Results  Component Value Date   WBC 11.9 (H) 03/31/2012   HGB 14.9 03/31/2012   HCT 42.8 03/31/2012   MCV 102.1 (H) 03/31/2012   PLT 125 (L) 03/31/2012      Component Value Date/Time   NA 141 03/31/2012 0405   K 4.2 03/31/2012 0405   CL 103 03/31/2012 0405   CO2 27 03/31/2012 0405   GLUCOSE 113 (H) 03/31/2012 0405   BUN 8 03/31/2012 0405   CREATININE 0.55 03/31/2012 0405   CALCIUM 9.0 03/31/2012 0405   PROT 6.6 03/30/2012 1156   ALBUMIN 3.6 03/30/2012 1156   AST 37 03/30/2012 1156   ALT 20 03/30/2012 1156   ALKPHOS 50 03/30/2012 1156   BILITOT 1.0 03/30/2012 1156   GFRNONAA >90 03/31/2012 0405   GFRAA >90 03/31/2012 0405   No results found for: CHOL, HDL, LDLCALC, LDLDIRECT, TRIG, CHOLHDL No results found for: ZOXW9UHGBA1C No results found for: VITAMINB12 No results found for: TSH   09/13/18 MRI brain [I reviewed images myself and agree with interpretation. -VRP]  - Moderate atrophy for age. Also ventriculomegaly. This may be due to atrophy but a component of normal pressure hydrocephalus is not excluded. Focal encephalomalacia of the right inferior frontal and  superior temporal gyrus laterally.  B12 277    ASSESSMENT AND PLAN  68 y.o. year old male here with:  Dx:  1. Gait difficulty   2. Alcohol dependence with unspecified alcohol-induced disorder [facility](HCC)    Virtual Visit via Video Note  I connected with Shelva MajesticGary T Yerby on 12/06/18 at 10:30 AM EDT by a video enabled telemedicine application and verified that I am speaking with the correct person using two identifiers.   I discussed the limitations of evaluation and management by telemedicine and the availability of in person appointments. The patient expressed understanding and  agreed to proceed.   I discussed the assessment and treatment plan with the patient. The patient was provided an opportunity to ask questions and all were answered. The patient agreed with the plan and demonstrated an understanding of the instructions.   The patient was advised to call back or seek an in-person evaluation if the symptoms worsen or if the condition fails to improve as anticipated.  I provided 25 minutes of non-face-to-face time during this encounter.   PLAN:  GAIT DIFFICULTY (likely related to chronic alcoholic abuse; alcoholic cerebellar degeneration; alcoholic peripheral neuropathy) - gradually cut down etoh use (no more than 1 drink per day) - continue vitamin supplements (B12 level 277; recommend to optimize > 300; also use folic acid and thiamine supplements) - consider PT evaluation - consider in office neurology evaluation in 2-3 months  Return for pending if symptoms worsen or fail to improve, return to PCP.    Suanne Marker, MD 12/06/2018, 10:38 AM Certified in Neurology, Neurophysiology and Neuroimaging  [facility] [address], [address] [place], [place] [address] [phone]

## 2018-12-17 ENCOUNTER — Ambulatory Visit: Payer: Self-pay | Admitting: Diagnostic Neuroimaging

## 2019-02-10 DIAGNOSIS — R69 Illness, unspecified: Secondary | ICD-10-CM | POA: Diagnosis not present

## 2019-11-03 DIAGNOSIS — Z9181 History of falling: Secondary | ICD-10-CM | POA: Diagnosis not present

## 2019-11-03 DIAGNOSIS — R2689 Other abnormalities of gait and mobility: Secondary | ICD-10-CM | POA: Diagnosis not present

## 2019-11-03 DIAGNOSIS — R29898 Other symptoms and signs involving the musculoskeletal system: Secondary | ICD-10-CM | POA: Diagnosis not present

## 2020-02-06 DIAGNOSIS — H2511 Age-related nuclear cataract, right eye: Secondary | ICD-10-CM | POA: Diagnosis not present

## 2020-02-06 DIAGNOSIS — H5213 Myopia, bilateral: Secondary | ICD-10-CM | POA: Diagnosis not present

## 2020-02-06 DIAGNOSIS — H2512 Age-related nuclear cataract, left eye: Secondary | ICD-10-CM | POA: Diagnosis not present

## 2020-02-15 DIAGNOSIS — H524 Presbyopia: Secondary | ICD-10-CM | POA: Diagnosis not present

## 2020-02-15 DIAGNOSIS — H52223 Regular astigmatism, bilateral: Secondary | ICD-10-CM | POA: Diagnosis not present

## 2020-12-21 ENCOUNTER — Encounter (HOSPITAL_COMMUNITY): Payer: Self-pay | Admitting: Emergency Medicine

## 2020-12-21 ENCOUNTER — Emergency Department (HOSPITAL_COMMUNITY): Payer: Medicare (Managed Care)

## 2020-12-21 ENCOUNTER — Inpatient Hospital Stay (HOSPITAL_COMMUNITY)
Admission: EM | Admit: 2020-12-21 | Discharge: 2020-12-24 | DRG: 482 | Disposition: A | Discharge status: Home Health | Payer: Medicare (Managed Care) | Attending: Internal Medicine | Admitting: Internal Medicine

## 2020-12-21 ENCOUNTER — Other Ambulatory Visit: Payer: Self-pay

## 2020-12-21 DIAGNOSIS — Z880 Allergy status to penicillin: Secondary | ICD-10-CM

## 2020-12-21 DIAGNOSIS — Z87820 Personal history of traumatic brain injury: Secondary | ICD-10-CM

## 2020-12-21 DIAGNOSIS — F1010 Alcohol abuse, uncomplicated: Secondary | ICD-10-CM | POA: Diagnosis present

## 2020-12-21 DIAGNOSIS — S72001A Fracture of unspecified part of neck of right femur, initial encounter for closed fracture: Secondary | ICD-10-CM

## 2020-12-21 DIAGNOSIS — M25511 Pain in right shoulder: Secondary | ICD-10-CM | POA: Diagnosis present

## 2020-12-21 DIAGNOSIS — Y9301 Activity, walking, marching and hiking: Secondary | ICD-10-CM | POA: Diagnosis present

## 2020-12-21 DIAGNOSIS — W010XXA Fall on same level from slipping, tripping and stumbling without subsequent striking against object, initial encounter: Secondary | ICD-10-CM | POA: Diagnosis present

## 2020-12-21 DIAGNOSIS — Z20822 Contact with and (suspected) exposure to covid-19: Secondary | ICD-10-CM | POA: Diagnosis present

## 2020-12-21 DIAGNOSIS — S7291XA Unspecified fracture of right femur, initial encounter for closed fracture: Secondary | ICD-10-CM | POA: Diagnosis present

## 2020-12-21 DIAGNOSIS — F17290 Nicotine dependence, other tobacco product, uncomplicated: Secondary | ICD-10-CM | POA: Diagnosis present

## 2020-12-21 DIAGNOSIS — W19XXXA Unspecified fall, initial encounter: Secondary | ICD-10-CM

## 2020-12-21 DIAGNOSIS — M25561 Pain in right knee: Secondary | ICD-10-CM | POA: Diagnosis present

## 2020-12-21 DIAGNOSIS — Z720 Tobacco use: Secondary | ICD-10-CM

## 2020-12-21 DIAGNOSIS — R296 Repeated falls: Secondary | ICD-10-CM

## 2020-12-21 DIAGNOSIS — D72829 Elevated white blood cell count, unspecified: Secondary | ICD-10-CM | POA: Diagnosis present

## 2020-12-21 DIAGNOSIS — Z419 Encounter for procedure for purposes other than remedying health state, unspecified: Secondary | ICD-10-CM

## 2020-12-21 DIAGNOSIS — S72141A Displaced intertrochanteric fracture of right femur, initial encounter for closed fracture: Principal | ICD-10-CM | POA: Diagnosis present

## 2020-12-21 LAB — CBC WITH DIFFERENTIAL/PLATELET
Abs Immature Granulocytes: 0.07 10*3/uL (ref 0.00–0.07)
Basophils Absolute: 0 10*3/uL (ref 0.0–0.1)
Basophils Relative: 0 %
Eosinophils Absolute: 0 10*3/uL (ref 0.0–0.5)
Eosinophils Relative: 0 %
HCT: 43.5 % (ref 39.0–52.0)
Hemoglobin: 15.1 g/dL (ref 13.0–17.0)
Immature Granulocytes: 1 %
Lymphocytes Relative: 13 %
Lymphs Abs: 1.8 10*3/uL (ref 0.7–4.0)
MCH: 34.5 pg — ABNORMAL HIGH (ref 26.0–34.0)
MCHC: 34.7 g/dL (ref 30.0–36.0)
MCV: 99.3 fL (ref 80.0–100.0)
Monocytes Absolute: 1.2 10*3/uL — ABNORMAL HIGH (ref 0.1–1.0)
Monocytes Relative: 9 %
Neutro Abs: 10.8 10*3/uL — ABNORMAL HIGH (ref 1.7–7.7)
Neutrophils Relative %: 77 %
Platelets: 175 10*3/uL (ref 150–400)
RBC: 4.38 MIL/uL (ref 4.22–5.81)
RDW: 12.5 % (ref 11.5–15.5)
WBC: 13.9 10*3/uL — ABNORMAL HIGH (ref 4.0–10.5)
nRBC: 0 % (ref 0.0–0.2)

## 2020-12-21 LAB — URINALYSIS, ROUTINE W REFLEX MICROSCOPIC
Bacteria, UA: NONE SEEN
Bilirubin Urine: NEGATIVE
Glucose, UA: NEGATIVE mg/dL
Ketones, ur: 5 mg/dL — AB
Leukocytes,Ua: NEGATIVE
Nitrite: NEGATIVE
Protein, ur: NEGATIVE mg/dL
Specific Gravity, Urine: 1.008 (ref 1.005–1.030)
pH: 6 (ref 5.0–8.0)

## 2020-12-21 LAB — CBG MONITORING, ED: Glucose-Capillary: 110 mg/dL — ABNORMAL HIGH (ref 70–99)

## 2020-12-21 LAB — BASIC METABOLIC PANEL
Anion gap: 14 (ref 5–15)
BUN: 7 mg/dL — ABNORMAL LOW (ref 8–23)
CO2: 21 mmol/L — ABNORMAL LOW (ref 22–32)
Calcium: 8.9 mg/dL (ref 8.9–10.3)
Chloride: 99 mmol/L (ref 98–111)
Creatinine, Ser: 0.54 mg/dL — ABNORMAL LOW (ref 0.61–1.24)
GFR, Estimated: >60 mL/min (ref >60)
Glucose, Bld: 112 mg/dL — ABNORMAL HIGH (ref 70–99)
Potassium: 3.5 mmol/L (ref 3.5–5.1)
Sodium: 134 mmol/L — ABNORMAL LOW (ref 135–145)

## 2020-12-21 MED ORDER — LORAZEPAM 1 MG PO TABS: 0.0000 mg | ORAL_TABLET | Freq: Four times a day (QID) | ORAL | Status: AC

## 2020-12-21 MED ORDER — THIAMINE HCL 100 MG/ML IJ SOLN: 100.0000 mg | Freq: Every day | INTRAMUSCULAR | Status: DC

## 2020-12-21 MED ORDER — ACETAMINOPHEN 500 MG PO TABS
1000.0000 mg | ORAL_TABLET | Freq: Once | ORAL | Status: AC
  Administered 2020-12-21: 1000 mg via ORAL
  Filled 2020-12-21: qty 2

## 2020-12-21 MED ORDER — THIAMINE HCL 100 MG PO TABS
100.0000 mg | ORAL_TABLET | Freq: Every day | ORAL | Status: DC
  Administered 2020-12-22 – 2020-12-24 (×3): 100 mg via ORAL
  Filled 2020-12-21 (×3): qty 1

## 2020-12-21 MED ORDER — MORPHINE SULFATE (PF) 4 MG/ML IV SOLN
4.0000 mg | Freq: Once | INTRAVENOUS | Status: AC
  Administered 2020-12-21: 4 mg via INTRAVENOUS
  Filled 2020-12-21: qty 1

## 2020-12-21 MED ORDER — MORPHINE SULFATE (PF) 2 MG/ML IV SOLN
2.0000 mg | INTRAVENOUS | Status: DC | PRN
Start: 2020-12-21 — End: 2020-12-22

## 2020-12-21 MED ORDER — HYDROMORPHONE HCL 1 MG/ML IJ SOLN: 0.5000 mg | INTRAMUSCULAR | Status: DC | PRN

## 2020-12-21 MED ORDER — NICOTINE 21 MG/24HR TD PT24
21.0000 mg | MEDICATED_PATCH | Freq: Every day | TRANSDERMAL | Status: DC
  Administered 2020-12-22 – 2020-12-24 (×4): 21 mg via TRANSDERMAL
  Filled 2020-12-21 (×4): qty 1

## 2020-12-21 MED ORDER — LORAZEPAM 2 MG/ML IJ SOLN: 0.0000 mg | Freq: Two times a day (BID) | INTRAMUSCULAR | Status: DC

## 2020-12-21 MED ORDER — LORAZEPAM 1 MG PO TABS: 0.0000 mg | ORAL_TABLET | Freq: Two times a day (BID) | ORAL | Status: DC

## 2020-12-21 MED ORDER — LORAZEPAM 2 MG/ML IJ SOLN: 0.0000 mg | Freq: Four times a day (QID) | INTRAMUSCULAR | Status: AC

## 2020-12-21 NOTE — ED Triage Notes (Signed)
Per EMS- patient states he fell yesterday and c/o right knee pain. Patient has been drinking alcohol. + pedal pulses.

## 2020-12-21 NOTE — H&P (Signed)
History and Physical    Raymond Quinn Raymond Quinn DOB: 1950-11-13 DOA: 12/21/2020  PCP: Default, Provider, MD  Patient coming from: Home  I have personally briefly reviewed patient's old medical records in [facility]  Chief Complaint: Fall and right sided shoulder, hip and knee pain  HPI: Raymond Quinn is a 70 y.o. male with medical history significant for TBI with history of subarachnoid hemorrhage, EtOH abuse and recurrent falls who presents with concerns of fall yesterday with resulting right-sided shoulder, hip and knee pain.  Patient reports that he must of fallen "50 times in the past 2 years."  Thinks he gets vertigo, dizziness and is unsteady on his feet.  Yesterday he was just walking from his garage to the yard and fell on his right side and has been unable to bear weight.  Patient reports drinking 1 bottle of wine per day with his last drink about 5 hours prior to ED eval.  Also smokes 3 to 4 cigars a day.  States he does not do any other drugs.  Does not take any other medications.  Denies any vision changes and wears prescription glasses.  Denies any memory loss or confusion.  No peripheral neuropathy.  ED Course: He was afebrile normotensive on room air.  CBC shows leukocytosis of 13.9.  Sodium 134, BG of 112.  Negative shoulder and knee x-ray.  Right hip x-ray shows proximal femur fracture. ED PA Greta Doom case with orthopedic Dr. Victorino Dike who will see patient in consultation tomorrow and advise n.p.o. after midnight.  Review of Systems: Constitutional: No Weight Change, No Fever ENT/Mouth: No sore throat, No Rhinorrhea Eyes: No Eye Pain, No Vision Changes Cardiovascular: No Chest Pain, no SOB Respiratory: No Cough, No Sputum, No Wheezing, no Dyspnea  Gastrointestinal: No Nausea, No Vomiting, No Diarrhea, No Constipation, No Pain Genitourinary: no Urinary Incontinence, Musculoskeletal: No Arthralgias, No Myalgias Skin: No Skin Lesions, No Pruritus, Neuro: + Weakness, No  Numbness,  No Loss of Consciousness, No Syncope Psych: No Anxiety/Panic, No Depression, no decrease appetite Heme/Lymph: No Bruising, No Bleeding  Past Medical History:  Diagnosis Date  . Frequent falls   . No pertinent past medical history   . Pneumothorax 2013   hx of   . SAH (subarachnoid hemorrhage) (HCC) 2013   s/p fall    Past Surgical History:  Procedure Laterality Date  . KNEE ARTHROSCOPY Left      reports that he has been smoking cigars. He has been smoking about 1.50 packs per day. He has never used smokeless tobacco. He reports current alcohol use of about 5.0 standard drinks of alcohol per week. He reports that he does not use drugs. Social History  Allergies  Allergen Reactions  . Penicillins Swelling    History reviewed. No pertinent family history.   Prior to Admission medications   Not on File    Physical Exam: Vitals:   12/21/20 2149 12/21/20 2200 12/21/20 2212 12/21/20 2230  BP: (!) 137/94 134/80 134/80 135/82  Pulse: 82 95 81 76  Resp: (!) 24 (!) 21  (!) 21  Temp:      TempSrc:      SpO2: 97% 97%  96%  Weight:      Height:        Constitutional: NAD, calm, comfortable, elderly gentleman laying asleep at 40 incline in bed Vitals:   12/21/20 2149 12/21/20 2200 12/21/20 2212 12/21/20 2230  BP: (!) 137/94 134/80 134/80 135/82  Pulse: 82 95 81 76  Resp: (!) 24 (!) 21  (!) 21  Temp:      TempSrc:      SpO2: 97% 97%  96%  Weight:      Height:       Eyes: PERRL, lids and conjunctivae normal ENMT: Mucous membranes are moist.  Neck: normal, supple Respiratory: clear to auscultation bilaterally, no wheezing, no crackles. Normal respiratory effort. No accessory muscle use.  Cardiovascular: Regular rate and rhythm, no murmurs / rubs / gallops. No extremity edema. 2+ pedal pulses.   Abdomen: no tenderness, no masses palpated.Bowel sounds positive.  Musculoskeletal: no clubbing / cyanosis. No joint deformity upper and lower extremities. Good ROM,  no contractures. Normal muscle tone.  Skin: no rashes, lesions, ulcers. No induration Neurologic: CN 2-12 grossly intact.  No nystagmus.  Sensation intact, unable to tolerate lower extremity strength testing due to pain.   Psychiatric: Normal judgment and insight. Alert and oriented x 3. Normal mood.     Labs on Admission: I have personally reviewed following labs and imaging studies  CBC: Recent Labs  Lab 12/21/20 2149  WBC 13.9*  NEUTROABS 10.8*  HGB 15.1  HCT 43.5  MCV 99.3  PLT 175   Basic Metabolic Panel: Recent Labs  Lab 12/21/20 2149  NA 134*  K 3.5  CL 99  CO2 21*  GLUCOSE 112*  BUN 7*  CREATININE 0.54*  CALCIUM 8.9   GFR: Estimated Creatinine Clearance: 78.3 mL/min (A) (by C-G formula based on SCr of 0.54 mg/dL (L)). Liver Function Tests: No results for input(s): AST, ALT, ALKPHOS, BILITOT, PROT, ALBUMIN in the last 168 hours. No results for input(s): LIPASE, AMYLASE in the last 168 hours. No results for input(s): AMMONIA in the last 168 hours. Coagulation Profile: No results for input(s): INR, PROTIME in the last 168 hours. Cardiac Enzymes: No results for input(s): CKTOTAL, CKMB, CKMBINDEX, TROPONINI in the last 168 hours. BNP (last 3 results) No results for input(s): PROBNP in the last 8760 hours. HbA1C: No results for input(s): HGBA1C in the last 72 hours. CBG: Recent Labs  Lab 12/21/20 2221  GLUCAP 110*   Lipid Profile: No results for input(s): CHOL, HDL, LDLCALC, TRIG, CHOLHDL, LDLDIRECT in the last 72 hours. Thyroid Function Tests: No results for input(s): TSH, T4TOTAL, FREET4, T3FREE, THYROIDAB in the last 72 hours. Anemia Panel: No results for input(s): VITAMINB12, FOLATE, FERRITIN, TIBC, IRON, RETICCTPCT in the last 72 hours. Urine analysis:    Component Value Date/Time   COLORURINE YELLOW 03/30/2012 1608   APPEARANCEUR HAZY (A) 03/30/2012 1608   LABSPEC 1.024 03/30/2012 1608   PHURINE 6.5 03/30/2012 1608   GLUCOSEU NEGATIVE  03/30/2012 1608   HGBUR MODERATE (A) 03/30/2012 1608   BILIRUBINUR NEGATIVE 03/30/2012 1608   KETONESUR 15 (A) 03/30/2012 1608   PROTEINUR 30 (A) 03/30/2012 1608   UROBILINOGEN 1.0 03/30/2012 1608   NITRITE NEGATIVE 03/30/2012 1608   LEUKOCYTESUR NEGATIVE 03/30/2012 1608    Radiological Exams on Admission: DG Shoulder Right  Result Date: 12/21/2020 CLINICAL DATA:  Status post fall. EXAM: RIGHT SHOULDER - 2+ VIEW COMPARISON:  None. FINDINGS: There is no evidence of acute fracture or dislocation. Multiple chronic right-sided rib fractures are noted. There is no evidence of arthropathy. Mild chronic bowing of the right humeral shaft is seen. Soft tissues are unremarkable. IMPRESSION: No acute osseous abnormality. Electronically Signed   By: Aram Candela M.D.   On: 12/21/2020 21:06   DG Knee Complete 4 Views Right  Result Date: 12/21/2020 CLINICAL DATA:  Recent  fall with knee pain, initial encounter EXAM: RIGHT KNEE - COMPLETE 4+ VIEW COMPARISON:  None. FINDINGS: No evidence of fracture, dislocation, or joint effusion. No evidence of arthropathy or other focal bone abnormality. Soft tissues are unremarkable. IMPRESSION: No acute abnormality noted. Electronically Signed   By: Alcide Clever M.D.   On: 12/21/2020 21:03   DG Hip Unilat W or Wo Pelvis 2-3 Views Right  Result Date: 12/21/2020 CLINICAL DATA:  Status post fall. EXAM: DG HIP (WITH OR WITHOUT PELVIS) 2-3V RIGHT COMPARISON:  None. FINDINGS: An acute, comminuted fracture deformity is seen extending through the inter trochanteric region of the proximal right femur. There is no evidence of dislocation. Soft tissue swelling is seen along the lateral aspect of the right hip. IMPRESSION: Acute fracture of the proximal right femur. Electronically Signed   By: Aram Candela M.D.   On: 12/21/2020 21:07      Assessment/Plan  Right proximal femur fracture s/p fall -Recurrent fall likely secondary to heavy alcohol use -Orthopedic Dr. Victorino Dike  is aware and will see patient in consultation tomorrow -Keep n.p.o. past midnight - PRN morphine 2mg  q3hr for moderate pain and 0.5mg  q4hr Dilaudid for severe pain   Recurrent falls - Ataxia likely secondary to alcohol use - no other signs to suggest Wernicke's - however will do daily Thiamine  ETOH use - use one bottle wine daily.  Can give few hours before admission. -No signs of withdrawal -on CIWA protocol  Tobacco use  - uses 4 cigars daily - nicotine patch   DVT prophylaxis:.SCDs Code Status: Full Family Communication: Plan discussed with patient at bedside  disposition Plan: Home with at least 2 midnight stays  Consults called:  Admission status: inpatient  Level of care: Med-Surg  Status is: Inpatient  Remains inpatient appropriate because:Inpatient level of care appropriate due to severity of illness   Dispo: The patient is from: Home              Anticipated d/c is to: Home              Patient currently is not medically stable to d/c.   Difficult to place patient No         DO [facility]   If 7PM-7AM, please contact night-coverage [email or web address]   12/21/2020, 11:03 PM

## 2020-12-21 NOTE — ED Provider Notes (Signed)
[facility]-EMERGENCY DEPT Provider Note   CSN: [id number] Arrival date & time: 12/21/20  1854     History Chief Complaint  Patient presents with  . Fall  . Knee Pain    Raymond Quinn is a 70 y.o. male.  The history is provided by the patient, a relative and medical records. No language interpreter was used.  Fall  Knee Pain    70 year old male significant history of recurrent falls, alcohol abuse, brought in by son for evaluation of a recent fall.  Patient states he has trouble with vertigo and would have frequent falls.  Yesterday while walking he tripped and fell landed on the right side.  States he struck his right shoulder right hip and right knee against the ground.  Reported cute onset of sharp throbbing pain to those affected area and was unable to stand up without assistance.  States he has not been able to bear any weight secondary to his pain.  Denies hitting his head or loss of consciousness.  Admits to alcohol use.  Denies any new numbness.  Denies precipitating symptoms prior to the fall.  No complaint of chest pain abdominal pain headache or confusion.  Patient is not on any blood thinner medication.  Past Medical History:  Diagnosis Date  . Frequent falls   . No pertinent past medical history   . Pneumothorax 2013   hx of   . SAH (subarachnoid hemorrhage) (HCC) 2013   s/p fall    Patient Active Problem List   Diagnosis Date Noted  . Traumatic brain injury (HCC) 03/30/2012  . Subarachnoid hemorrhage following injury (HCC) 03/30/2012  . Rib fractures 03/30/2012  . Closed left clavicular fracture 03/30/2012  . ETOH abuse 03/30/2012    Past Surgical History:  Procedure Laterality Date  . KNEE ARTHROSCOPY Left        History reviewed. No pertinent family history.  Social History   Tobacco Use  . Smoking status: Current Every Day Smoker    Packs/day: 1.50    Types: Cigars  . Smokeless tobacco: Never Used  Substance Use Topics   . Alcohol use: Yes    Alcohol/week: 5.0 standard drinks    Types: 5 Standard drinks or equivalent per week    Comment: 3 beers, 3 wine   . Drug use: No    Home Medications Prior to Admission medications   Not on File    Allergies    Penicillins  Review of Systems   Review of Systems  All other systems reviewed and are negative.   Physical Exam Updated Vital Signs BP 133/73   Pulse 89   Temp 98.4 F (36.9 C) (Oral)   Resp 20   Ht 5\' 8"  (1.727 m)   Wt 63.5 kg   SpO2 98%   BMI 21.29 kg/m   Physical Exam Vitals and nursing note reviewed.  Constitutional:      General: He is not in acute distress.    Appearance: He is well-developed.  HENT:     Head: Atraumatic.     Mouth/Throat:     Comments: Widespread dental decay Eyes:     Extraocular Movements: Extraocular movements intact.     Conjunctiva/sclera: Conjunctivae normal.     Pupils: Pupils are equal, round, and reactive to light.  Cardiovascular:     Rate and Rhythm: Normal rate and regular rhythm.     Pulses: Normal pulses.     Heart sounds: Normal heart sounds.  Pulmonary:  Effort: Pulmonary effort is normal.     Breath sounds: Normal breath sounds.  Abdominal:     Palpations: Abdomen is soft.  Musculoskeletal:        General: Tenderness (Right shoulder: Tenderness about the shoulder without any deformity.  Decreased shoulder raise secondary to pain.  Right hip: Tenderness to greater trochanter region without deformity.  Decreased range of motion secondary to pain.) present.     Cervical back: Normal range of motion and neck supple. No tenderness.     Comments: Right knee: Tenderness to patella on palpation with decreased flexion extension secondary to pain but no deformity.  Skin:    Capillary Refill: Capillary refill takes less than 2 seconds.     Findings: No rash.  Neurological:     Mental Status: He is alert. Mental status is at baseline.  Psychiatric:        Mood and Affect: Mood normal.      ED Results / Procedures / Treatments   Labs (all labs ordered are listed, but only abnormal results are displayed) Labs Reviewed - No data to display  EKG None  Radiology No results found.  Procedures Procedures   Medications Ordered in ED Medications  acetaminophen (TYLENOL) tablet 1,000 mg (has no administration in time range)    ED Course  I have reviewed the triage vital signs and the nursing notes.  Pertinent labs & imaging results that were available during my care of the patient were reviewed by me and considered in my medical decision making (see chart for details).    MDM Rules/Calculators/A&P                          BP 131/74   Pulse 86   Temp 98.4 F (36.9 C) (Oral)   Resp 20   Ht 5\' 8"  (1.727 m)   Wt 63.5 kg   SpO2 96%   BMI 21.29 kg/m   Final Clinical Impression(s) / ED Diagnoses Final diagnoses:  Fracture, proximal femur, right, closed, initial encounter (HCC)  Fall, initial encounter    Rx / DC Orders ED Discharge Orders    None     8:29 PM Patient fell yesterday landed on the right side and now complaining of pain to his right shoulder, hip, and knee.  Will obtain x-rays, Tylenol given.  History of recurrent falls and history of alcohol use.  Son is at bedside.  9:28 PM X-ray demonstrate acute fracture of the proximal right femur.  This is a closed injury.  X-ray of the right shoulder and right knee without acute fractures.  I made patient aware of his finding.  Will consult orthopedist, check labs, and will admit patient once labs resulted.  Care discussed with Dr.  9:38 PM Appreciate consultation from on call orthopedist Dr. Criss Alvine who request medicine admission.  Pt to be NPO after midnight and no blood thinner. Pt is made aware of plan.    9:43 PM Pt sign out to Dr. Victorino Dike who will consult medicine for admission once labs have resulted.    Criss Alvine, PA-C 12/21/20 02/20/21    [id number], MD 12/21/20 [phone]

## 2020-12-21 NOTE — ED Provider Notes (Signed)
Care transferred to me.  Patient placed on CIWA protocol.  Otherwise he was given IV pain meds.  Labs are fairly unremarkable.  Hospitalist consulted for admission.   Pricilla Loveless, MD 12/21/20 2248

## 2020-12-21 NOTE — ED Notes (Signed)
Patient noted to have sat up and scooted to end of the bed. Patient states he has to use the restroom. Provided with a urinal and instructed again not to get up due to his hip and femur fracture.

## 2020-12-22 ENCOUNTER — Inpatient Hospital Stay (HOSPITAL_COMMUNITY): Payer: Medicare (Managed Care)

## 2020-12-22 ENCOUNTER — Inpatient Hospital Stay (HOSPITAL_COMMUNITY): Payer: Medicare (Managed Care) | Admitting: Certified Registered"

## 2020-12-22 ENCOUNTER — Encounter (HOSPITAL_COMMUNITY)
Admission: EM | Disposition: A | Discharge status: Home Health | Payer: Self-pay | Source: Home / Self Care | Attending: Internal Medicine

## 2020-12-22 DIAGNOSIS — F1010 Alcohol abuse, uncomplicated: Secondary | ICD-10-CM

## 2020-12-22 DIAGNOSIS — S7291XA Unspecified fracture of right femur, initial encounter for closed fracture: Secondary | ICD-10-CM

## 2020-12-22 DIAGNOSIS — R296 Repeated falls: Secondary | ICD-10-CM

## 2020-12-22 DIAGNOSIS — Z720 Tobacco use: Secondary | ICD-10-CM

## 2020-12-22 HISTORY — PX: INTRAMEDULLARY (IM) NAIL INTERTROCHANTERIC: SHX5875

## 2020-12-22 LAB — CBC
HCT: 40.1 % (ref 39.0–52.0)
Hemoglobin: 13.8 g/dL (ref 13.0–17.0)
MCH: 33.7 pg (ref 26.0–34.0)
MCHC: 34.4 g/dL (ref 30.0–36.0)
MCV: 98 fL (ref 80.0–100.0)
Platelets: 159 10*3/uL (ref 150–400)
RBC: 4.09 MIL/uL — ABNORMAL LOW (ref 4.22–5.81)
RDW: 12.3 % (ref 11.5–15.5)
WBC: 10.3 10*3/uL (ref 4.0–10.5)
nRBC: 0 % (ref 0.0–0.2)

## 2020-12-22 LAB — SURGICAL PCR SCREEN
MRSA, PCR: NEGATIVE
Staphylococcus aureus: NEGATIVE

## 2020-12-22 LAB — RESP PANEL BY RT-PCR (FLU A&B, COVID) ARPGX2
Influenza A by PCR: NEGATIVE
Influenza B by PCR: NEGATIVE
SARS Coronavirus 2 by RT PCR: NEGATIVE

## 2020-12-22 LAB — SARS CORONAVIRUS 2 (TAT 6-24 HRS): SARS Coronavirus 2: NEGATIVE

## 2020-12-22 LAB — BASIC METABOLIC PANEL
Anion gap: 9 (ref 5–15)
BUN: 8 mg/dL (ref 8–23)
CO2: 23 mmol/L (ref 22–32)
Calcium: 8.5 mg/dL — ABNORMAL LOW (ref 8.9–10.3)
Chloride: 100 mmol/L (ref 98–111)
Creatinine, Ser: 0.53 mg/dL — ABNORMAL LOW (ref 0.61–1.24)
GFR, Estimated: >60 mL/min (ref >60)
Glucose, Bld: 93 mg/dL (ref 70–99)
Potassium: 3.8 mmol/L (ref 3.5–5.1)
Sodium: 132 mmol/L — ABNORMAL LOW (ref 135–145)

## 2020-12-22 SURGERY — FIXATION, FRACTURE, INTERTROCHANTERIC, WITH INTRAMEDULLARY ROD
Anesthesia: General | Site: Hip | Laterality: Right

## 2020-12-22 MED ORDER — HYDROMORPHONE HCL 1 MG/ML IJ SOLN
0.2500 mg | INTRAMUSCULAR | Status: DC | PRN
  Administered 2020-12-22: 0.5 mg via INTRAVENOUS
  Administered 2020-12-22: 0.25 mg via INTRAVENOUS

## 2020-12-22 MED ORDER — MEPERIDINE HCL 50 MG/ML IJ SOLN: 6.2500 mg | INTRAMUSCULAR | Status: DC | PRN

## 2020-12-22 MED ORDER — FENTANYL CITRATE (PF) 100 MCG/2ML IJ SOLN
INTRAMUSCULAR | Status: AC
  Filled 2020-12-22: qty 2

## 2020-12-22 MED ORDER — TRANEXAMIC ACID-NACL 1000-0.7 MG/100ML-% IV SOLN
1000.0000 mg | Freq: Once | INTRAVENOUS | Status: AC
  Administered 2020-12-22: 1000 mg via INTRAVENOUS
  Filled 2020-12-22: qty 100

## 2020-12-22 MED ORDER — MORPHINE SULFATE (PF) 2 MG/ML IV SOLN
0.5000 mg | INTRAVENOUS | Status: DC | PRN
  Filled 2020-12-22: qty 1

## 2020-12-22 MED ORDER — DEXAMETHASONE SODIUM PHOSPHATE 10 MG/ML IJ SOLN
INTRAMUSCULAR | Status: DC | PRN
  Administered 2020-12-22: 4 mg via INTRAVENOUS

## 2020-12-22 MED ORDER — METOCLOPRAMIDE HCL 5 MG PO TABS: 5.0000 mg | ORAL_TABLET | Freq: Three times a day (TID) | ORAL | Status: DC | PRN

## 2020-12-22 MED ORDER — BISACODYL 10 MG RE SUPP: 10.0000 mg | Freq: Every day | RECTAL | Status: DC | PRN

## 2020-12-22 MED ORDER — PROMETHAZINE HCL 25 MG/ML IJ SOLN: 6.2500 mg | INTRAMUSCULAR | Status: DC | PRN

## 2020-12-22 MED ORDER — HYDROMORPHONE HCL 1 MG/ML IJ SOLN
INTRAMUSCULAR | Status: AC
  Administered 2020-12-22: 0.25 mg via INTRAVENOUS
  Filled 2020-12-22: qty 1

## 2020-12-22 MED ORDER — LABETALOL HCL 5 MG/ML IV SOLN
INTRAVENOUS | Status: DC | PRN
  Administered 2020-12-22 (×2): 5 mg via INTRAVENOUS

## 2020-12-22 MED ORDER — LIDOCAINE 2% (20 MG/ML) 5 ML SYRINGE
INTRAMUSCULAR | Status: DC | PRN
  Administered 2020-12-22: 60 mg via INTRAVENOUS

## 2020-12-22 MED ORDER — ENOXAPARIN SODIUM 40 MG/0.4ML IJ SOSY
40.0000 mg | PREFILLED_SYRINGE | INTRAMUSCULAR | Status: DC
  Administered 2020-12-23 – 2020-12-24 (×2): 40 mg via SUBCUTANEOUS
  Filled 2020-12-22 (×2): qty 0.4

## 2020-12-22 MED ORDER — HYDROCODONE-ACETAMINOPHEN 5-325 MG PO TABS
1.0000 | ORAL_TABLET | ORAL | Status: DC | PRN
  Administered 2020-12-22 – 2020-12-23 (×4): 2 via ORAL
  Administered 2020-12-23: 1 via ORAL
  Filled 2020-12-22: qty 1
  Filled 2020-12-22 (×5): qty 2

## 2020-12-22 MED ORDER — PHENOL 1.4 % MT LIQD: 1.0000 | OROMUCOSAL | Status: DC | PRN

## 2020-12-22 MED ORDER — HYDROCODONE-ACETAMINOPHEN 7.5-325 MG PO TABS
1.0000 | ORAL_TABLET | ORAL | Status: DC | PRN
  Administered 2020-12-24: 2 via ORAL
  Filled 2020-12-22: qty 2

## 2020-12-22 MED ORDER — FENTANYL CITRATE (PF) 250 MCG/5ML IJ SOLN
INTRAMUSCULAR | Status: DC | PRN
  Administered 2020-12-22 (×2): 100 ug via INTRAVENOUS

## 2020-12-22 MED ORDER — ONDANSETRON HCL 4 MG/2ML IJ SOLN
INTRAMUSCULAR | Status: DC | PRN
  Administered 2020-12-22: 4 mg via INTRAVENOUS

## 2020-12-22 MED ORDER — KCL IN DEXTROSE-NACL 20-5-0.45 MEQ/L-%-% IV SOLN
INTRAVENOUS | Status: DC
  Filled 2020-12-22 (×2): qty 1000

## 2020-12-22 MED ORDER — ROCURONIUM BROMIDE 10 MG/ML (PF) SYRINGE
PREFILLED_SYRINGE | INTRAVENOUS | Status: DC | PRN
  Administered 2020-12-22: 60 mg via INTRAVENOUS

## 2020-12-22 MED ORDER — PROPOFOL 10 MG/ML IV BOLUS
INTRAVENOUS | Status: AC
  Filled 2020-12-22: qty 40

## 2020-12-22 MED ORDER — ONDANSETRON HCL 4 MG PO TABS: 4.0000 mg | ORAL_TABLET | Freq: Four times a day (QID) | ORAL | Status: DC | PRN

## 2020-12-22 MED ORDER — LIDOCAINE 2% (20 MG/ML) 5 ML SYRINGE
INTRAMUSCULAR | Status: AC
  Filled 2020-12-22: qty 5

## 2020-12-22 MED ORDER — AMISULPRIDE (ANTIEMETIC) 5 MG/2ML IV SOLN: 10.0000 mg | Freq: Once | INTRAVENOUS | Status: DC | PRN

## 2020-12-22 MED ORDER — MAGNESIUM CITRATE PO SOLN: 1.0000 | Freq: Once | ORAL | Status: DC | PRN

## 2020-12-22 MED ORDER — MENTHOL 3 MG MT LOZG: 1.0000 | LOZENGE | OROMUCOSAL | Status: DC | PRN

## 2020-12-22 MED ORDER — SUGAMMADEX SODIUM 200 MG/2ML IV SOLN
INTRAVENOUS | Status: DC | PRN
  Administered 2020-12-22: 150 mg via INTRAVENOUS

## 2020-12-22 MED ORDER — CEFAZOLIN SODIUM-DEXTROSE 2-4 GM/100ML-% IV SOLN
INTRAVENOUS | Status: AC
  Filled 2020-12-22: qty 100

## 2020-12-22 MED ORDER — OXYCODONE HCL 5 MG/5ML PO SOLN: 5.0000 mg | Freq: Once | ORAL | Status: DC | PRN

## 2020-12-22 MED ORDER — PHENYLEPHRINE HCL (PRESSORS) 10 MG/ML IV SOLN
INTRAVENOUS | Status: AC
  Filled 2020-12-22: qty 1

## 2020-12-22 MED ORDER — ONDANSETRON HCL 4 MG/2ML IJ SOLN
INTRAMUSCULAR | Status: AC
  Filled 2020-12-22: qty 2

## 2020-12-22 MED ORDER — CEFAZOLIN SODIUM-DEXTROSE 2-4 GM/100ML-% IV SOLN
2.0000 g | INTRAVENOUS | Status: AC
  Administered 2020-12-22: 2 g via INTRAVENOUS
  Filled 2020-12-22: qty 100

## 2020-12-22 MED ORDER — 0.9 % SODIUM CHLORIDE (POUR BTL) OPTIME
TOPICAL | Status: DC | PRN
  Administered 2020-12-22: 1000 mL

## 2020-12-22 MED ORDER — DOCUSATE SODIUM 100 MG PO CAPS
100.0000 mg | ORAL_CAPSULE | Freq: Two times a day (BID) | ORAL | Status: DC
  Administered 2020-12-22 – 2020-12-24 (×4): 100 mg via ORAL
  Filled 2020-12-22 (×4): qty 1

## 2020-12-22 MED ORDER — SODIUM CHLORIDE 0.9 % IV SOLN: INTRAVENOUS | Status: AC

## 2020-12-22 MED ORDER — ACETAMINOPHEN 325 MG PO TABS: 325.0000 mg | ORAL_TABLET | Freq: Four times a day (QID) | ORAL | Status: DC | PRN

## 2020-12-22 MED ORDER — TRANEXAMIC ACID-NACL 1000-0.7 MG/100ML-% IV SOLN
1000.0000 mg | INTRAVENOUS | Status: AC
  Administered 2020-12-22: 1000 mg via INTRAVENOUS
  Filled 2020-12-22: qty 100

## 2020-12-22 MED ORDER — DEXAMETHASONE SODIUM PHOSPHATE 10 MG/ML IJ SOLN
INTRAMUSCULAR | Status: AC
  Filled 2020-12-22: qty 1

## 2020-12-22 MED ORDER — LACTATED RINGERS IV SOLN: INTRAVENOUS | Status: DC | PRN

## 2020-12-22 MED ORDER — OXYCODONE HCL 5 MG PO TABS: 5.0000 mg | ORAL_TABLET | Freq: Once | ORAL | Status: DC | PRN

## 2020-12-22 MED ORDER — CHLORHEXIDINE GLUCONATE 4 % EX LIQD: 60.0000 mL | Freq: Once | CUTANEOUS | Status: DC

## 2020-12-22 MED ORDER — METOCLOPRAMIDE HCL 5 MG/ML IJ SOLN: 5.0000 mg | Freq: Three times a day (TID) | INTRAMUSCULAR | Status: DC | PRN

## 2020-12-22 MED ORDER — PROPOFOL 10 MG/ML IV BOLUS
INTRAVENOUS | Status: DC | PRN
  Administered 2020-12-22: 130 mg via INTRAVENOUS

## 2020-12-22 MED ORDER — MIDAZOLAM HCL 2 MG/2ML IJ SOLN
INTRAMUSCULAR | Status: DC | PRN
  Administered 2020-12-22: 2 mg via INTRAVENOUS

## 2020-12-22 MED ORDER — POVIDONE-IODINE 10 % EX SWAB: 2.0000 "application " | Freq: Once | CUTANEOUS | Status: DC

## 2020-12-22 MED ORDER — LABETALOL HCL 5 MG/ML IV SOLN
INTRAVENOUS | Status: AC
  Filled 2020-12-22: qty 4

## 2020-12-22 MED ORDER — ONDANSETRON HCL 4 MG/2ML IJ SOLN: 4.0000 mg | Freq: Four times a day (QID) | INTRAMUSCULAR | Status: DC | PRN

## 2020-12-22 MED ORDER — MIDAZOLAM HCL 2 MG/2ML IJ SOLN
INTRAMUSCULAR | Status: AC
  Filled 2020-12-22: qty 2

## 2020-12-22 MED ORDER — SENNOSIDES-DOCUSATE SODIUM 8.6-50 MG PO TABS: 1.0000 | ORAL_TABLET | Freq: Every evening | ORAL | Status: DC | PRN

## 2020-12-22 SURGICAL SUPPLY — 49 items
BAG ZIPLOCK 12X15 (MISCELLANEOUS) ×2 IMPLANT
BIT DRILL CANN LG 4.3MM (BIT) ×1 IMPLANT
BIT DRILL LAG SCREW (DRILL) ×1 IMPLANT
BNDG COHESIVE 6X5 TAN STRL LF (GAUZE/BANDAGES/DRESSINGS) ×2 IMPLANT
BNDG ELASTIC 6X5.8 VLCR STR LF (GAUZE/BANDAGES/DRESSINGS) ×2 IMPLANT
BNDG GAUZE ELAST 4 BULKY (GAUZE/BANDAGES/DRESSINGS) ×2 IMPLANT
COVER SURGICAL LIGHT HANDLE (MISCELLANEOUS) ×2 IMPLANT
COVER WAND RF STERILE (DRAPES) IMPLANT
DRAPE INCISE IOBAN 66X45 STRL (DRAPES) ×2 IMPLANT
DRAPE STERI IOBAN 125X83 (DRAPES) ×2 IMPLANT
DRAPE U-SHAPE 47X51 STRL (DRAPES) ×2 IMPLANT
DRESSING AQUACEL AG SP 3.5X4 (GAUZE/BANDAGES/DRESSINGS) ×1 IMPLANT
DRILL BIT CANN LG 4.3MM (BIT) ×2
DRILL LAG SCREW (DRILL) ×2
DRSG AQUACEL AG ADV 3.5X 4 (GAUZE/BANDAGES/DRESSINGS) ×4 IMPLANT
DRSG AQUACEL AG ADV 3.5X 6 (GAUZE/BANDAGES/DRESSINGS) ×2 IMPLANT
DRSG AQUACEL AG SP 3.5X4 (GAUZE/BANDAGES/DRESSINGS) ×2
DRSG PAD ABDOMINAL 8X10 ST (GAUZE/BANDAGES/DRESSINGS) ×4 IMPLANT
DURAPREP 26ML APPLICATOR (WOUND CARE) ×2 IMPLANT
ELECT REM PT RETURN 15FT ADLT (MISCELLANEOUS) ×2 IMPLANT
GAUZE SPONGE 4X4 12PLY STRL (GAUZE/BANDAGES/DRESSINGS) ×2 IMPLANT
GLOVE SRG 8 PF TXTR STRL LF DI (GLOVE) ×1 IMPLANT
GLOVE SURG LTX SZ8 (GLOVE) IMPLANT
GLOVE SURG ORTHO LTX SZ7.5 (GLOVE) ×4 IMPLANT
GLOVE SURG ORTHO LTX SZ8 (GLOVE) ×2 IMPLANT
GLOVE SURG UNDER POLY LF SZ7.5 (GLOVE) ×2 IMPLANT
GLOVE SURG UNDER POLY LF SZ8 (GLOVE) ×2
GOWN SPEC L3 XXLG W/TWL (GOWN DISPOSABLE) ×4 IMPLANT
GOWN STRL REUS W/TWL LRG LVL3 (GOWN DISPOSABLE) ×2 IMPLANT
GUIDEPIN 3.2X17.5 THRD DISP (PIN) ×2 IMPLANT
HIP FRAC NAIL LAG SCR 10.5X100 (Orthopedic Implant) ×2 IMPLANT
KIT BASIN OR (CUSTOM PROCEDURE TRAY) ×2 IMPLANT
KIT TURNOVER KIT A (KITS) ×2 IMPLANT
MANIFOLD NEPTUNE II (INSTRUMENTS) ×2 IMPLANT
NAIL HIP FRACT 130D 11X180 (Screw) ×2 IMPLANT
PACK GENERAL/GYN (CUSTOM PROCEDURE TRAY) ×2 IMPLANT
PAD CAST 4YDX4 CTTN HI CHSV (CAST SUPPLIES) ×1 IMPLANT
PADDING CAST COTTON 4X4 STRL (CAST SUPPLIES) ×2
PENCIL SMOKE EVACUATOR (MISCELLANEOUS) IMPLANT
PROTECTOR NERVE ULNAR (MISCELLANEOUS) ×4 IMPLANT
SCREW BONE CORTICAL 5.0X36 (Screw) ×2 IMPLANT
SCREW CANN THRD AFF 10.5X100 (Orthopedic Implant) ×1 IMPLANT
STAPLER VISISTAT 35W (STAPLE) ×2 IMPLANT
SUT MNCRL AB 4-0 PS2 18 (SUTURE) ×2 IMPLANT
SUT VIC AB 0 CT1 27 (SUTURE) ×4
SUT VIC AB 0 CT1 27XBRD ANTBC (SUTURE) ×2 IMPLANT
SUT VIC AB 2-0 CT1 27 (SUTURE) ×4
SUT VIC AB 2-0 CT1 27XBRD (SUTURE) ×2 IMPLANT
TOWEL OR 17X26 10 PK STRL BLUE (TOWEL DISPOSABLE) ×4 IMPLANT

## 2020-12-22 NOTE — Transfer of Care (Signed)
Immediate Anesthesia Transfer of Care Note  Patient: Raymond Quinn  Procedure(s) Performed: INTRAMEDULLARY (IM) NAIL INTERTROCHANTRIC (Right Hip)  Patient Location: PACU  Anesthesia Type:General  Level of Consciousness: awake, alert  and patient cooperative  Airway & Oxygen Therapy: Patient Spontanous Breathing and Patient connected to face mask oxygen  Post-op Assessment: Report given to RN and Post -op Vital signs reviewed and stable  Post vital signs: Reviewed and stable  Last Vitals:  Vitals Value Taken Time  BP 155/91 12/22/20 1212  Temp    Pulse 76 12/22/20 1213  Resp 15 12/22/20 1213  SpO2 100 % 12/22/20 1213  Vitals shown include unvalidated device data.  Last Pain:  Vitals:   12/22/20 0911  TempSrc: Oral  PainSc:       Patients Stated Pain Goal: 2 (12/21/20 2359)  Complications: No complications documented.

## 2020-12-22 NOTE — Op Note (Signed)
12/22/2020  11:59 AM  PATIENT:  Raymond Quinn  70 y.o. male  PRE-OPERATIVE DIAGNOSIS:  RIGHT HIP INTERTROCHANTERIC FRACTURE  POST-OPERATIVE DIAGNOSIS:  RIGHT HIP INTERTROCHANTERIC FRACTURE  Procedure(s): Open treatment of right hip intertrochanteric fracture with intramedullary nailing  SURGEON:  Toni Arthurs, MD  ASSISTANT: None  ANESTHESIA:   General  EBL: 50 cc  TOURNIQUET: None  COMPLICATIONS:  None apparent  DISPOSITION:  Extubated, awake and stable to recovery.  INDICATION FOR PROCEDURE: The patient is a 70 year old male with past medical history significant for alcoholism.  He fell at home a few days ago injuring his right hip.  He was unable to bear weight and presented to the emergency room last night.  Radiographs reveal a displaced intertrochanteric fracture.  He presents now for operative treatment of this displaced and unstable hip fracture.  The risks and benefits of the alternative treatment options have been discussed in detail.  The patient wishes to proceed with surgery and specifically understands risks of bleeding, infection, nerve damage, blood clots, need for additional surgery, amputation and death.  PROCEDURE IN DETAIL: After preoperative consent was obtained and the correct operative site was identified, the patient was brought to the operating room supine on hospital bed.  General anesthesia was administered.  Preoperative antibiotics were administered along with IV tranexamic acid.  A surgical timeout was taken.  The patient was then moved over onto the [place] bed.  The left lower extremity was placed in a padded well leg holder.  The right lower extremity was placed in a traction boot.  A reduction maneuver was then performed by abducting the hip and externally rotating it.  Traction was applied and then the hip was internally rotated and abducted.  Radiographs in the AP and lateral planes confirmed appropriate reduction of the fracture.  The right lower extremity  was then prepped and draped in standard sterile fashion with a shower curtain.  A longitudinal incision was made over the tip of the greater trochanter extending proximally about 3 inches.  Dissection was carried sharply down through the skin and subcutaneous tissues.  The gluteal fascia was incised and then the muscle split bluntly exposing the tip of the greater trochanter.  A guidepin was then placed at the tip of the greater trochanter.  AP and lateral radiographs confirmed appropriate position of the guidepin.  It was advanced into the medullary canal.  An entry reamer was used to open the medullary canal by drilling over the guidepin.  A [employer] affixus short nail was inserted and seated appropriately.  The targeting guide was used to percutaneously insert a guidepin into the femoral head.  AP and lateral radiographs confirmed appropriate position of the guidepin.  The guidepin was overdrilled.  A 100 mm lag screw was inserted and securely tightened after releasing traction.  The lag screw was then locked to the nail allowing compression of the fracture site.  The targeting guide was then used to insert a distal interlocking screw in the static position.  Final AP and lateral radiographs confirmed appropriate reduction of the fracture in appropriate position and length of all hardware.  The targeting guide was then removed.  The wounds were irrigated copiously.  Fascia was closed with 0 Vicryl.  Subcutaneous tissues were approximated with 2-0 Vicryl.  Skin incisions were closed with staples.  Sterile dressings were applied.  The patient was awakened from anesthesia and transported to the recovery room in stable condition.  FOLLOW UP PLAN: Weightbearing as tolerated on  the right lower extremity.  Physical therapy, Occupational Therapy and transition of care consultation.
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
## 2020-12-22 NOTE — Consult Note (Signed)
Reason for Consult: Right hip pain Referring Physician: Dr. Dorene Grebe Raymond Quinn is an 70 y.o. male.  HPI: The patient is a 70 year old male with past medical history significant for for smoking and alcohol abuse.  He reports that he fell sometime in the last few days in his yard on his right side.  He has been unable to walk.  He presented to the emergency room yesterday with a chief complaint of right hip pain.  He denies any history of injury or surgery to that hip in the past.  He smokes cigars.  He is nondiabetic.  He tells me he drinks at least a bottle of wine a day.  He lives with his youngest son.  He reports that he has not eaten in several days.  Incidentally complains of right knee pain has been bothering him for the last several months.  He has not had any evaluation or treatment of that condition.  He is admitted to the hospitalist service.  He has been n.p.o. since yesterday.  He is not on blood thinners.  He has a history of frequent falls after a traumatic brain injury that was sustained when he fell down some stairs a few years ago.  COVID test results are pending.  Past Medical History:  Diagnosis Date  . Frequent falls   . No pertinent past medical history   . Pneumothorax 2013   hx of   . SAH (subarachnoid hemorrhage) (HCC) 2013   s/p fall    Past Surgical History:  Procedure Laterality Date  . KNEE ARTHROSCOPY Left     History reviewed. No pertinent family history.  Social History:  reports that he has been smoking cigars. He has been smoking about 1.50 packs per day. He has never used smokeless tobacco. He reports current alcohol use of about 5.0 standard drinks of alcohol per week. He reports that he does not use drugs.  Allergies:  Allergies  Allergen Reactions  . Penicillins Swelling    Medications: I have reviewed the patient's current medications.  Results for orders placed or performed during the hospital encounter of 12/21/20 (from the past 48  hour(s))  Urinalysis, Routine w reflex microscopic Urine, Clean Catch     Status: Abnormal   Collection Time: 12/21/20  9:23 PM  Result Value Ref Range   Color, Urine STRAW (A) YELLOW   APPearance CLEAR CLEAR   Specific Gravity, Urine 1.008 1.005 - 1.030   pH 6.0 5.0 - 8.0   Glucose, UA NEGATIVE NEGATIVE mg/dL   Hgb urine dipstick SMALL (A) NEGATIVE   Bilirubin Urine NEGATIVE NEGATIVE   Ketones, ur 5 (A) NEGATIVE mg/dL   Protein, ur NEGATIVE NEGATIVE mg/dL   Nitrite NEGATIVE NEGATIVE   Leukocytes,Ua NEGATIVE NEGATIVE   RBC / HPF 0-5 0 - 5 RBC/hpf   WBC, UA 0-5 0 - 5 WBC/hpf   Bacteria, UA NONE SEEN NONE SEEN   Mucus PRESENT     Comment: Performed at [facility], [address], [place], [place] [address]  Basic metabolic panel     Status: Abnormal   Collection Time: 12/21/20  9:49 PM  Result Value Ref Range   Sodium 134 (L) 135 - 145 mmol/L   Potassium 3.5 3.5 - 5.1 mmol/L   Chloride 99 98 - 111 mmol/L   CO2 21 (L) 22 - 32 mmol/L   Glucose, Bld 112 (H) 70 - 99 mg/dL    Comment: Glucose reference range applies only to samples  taken after fasting for at least 8 hours.   BUN 7 (L) 8 - 23 mg/dL   Creatinine, Ser 2.45 (L) 0.61 - 1.24 mg/dL   Calcium 8.9 8.9 - 80.9 mg/dL   GFR, Estimated >98 >33 mL/min    Comment: (NOTE) Calculated using the CKD-EPI Creatinine Equation (2021)    Anion gap 14 5 - 15    Comment: Performed at [facility], [address], [place], [place] [address]  CBC with Differential     Status: Abnormal   Collection Time: 12/21/20  9:49 PM  Result Value Ref Range   WBC 13.9 (H) 4.0 - 10.5 K/uL   RBC 4.38 4.22 - 5.81 MIL/uL   Hemoglobin 15.1 13.0 - 17.0 g/dL   HCT 39.7 67.3 - 41.9 %   MCV 99.3 80.0 - 100.0 fL   MCH 34.5 (H) 26.0 - 34.0 pg   MCHC 34.7 30.0 - 36.0 g/dL   RDW 37.9 02.4 - 09.7 %   Platelets 175 150 - 400 K/uL   nRBC 0.0 0.0 - 0.2 %   Neutrophils Relative % 77 %   Neutro Abs 10.8 (H) 1.7 - 7.7 K/uL    Lymphocytes Relative 13 %   Lymphs Abs 1.8 0.7 - 4.0 K/uL   Monocytes Relative 9 %   Monocytes Absolute 1.2 (H) 0.1 - 1.0 K/uL   Eosinophils Relative 0 %   Eosinophils Absolute 0.0 0.0 - 0.5 K/uL   Basophils Relative 0 %   Basophils Absolute 0.0 0.0 - 0.1 K/uL   Immature Granulocytes 1 %   Abs Immature Granulocytes 0.07 0.00 - 0.07 K/uL    Comment: Performed at [facility], [address] [place], [place] [address]  CBG monitoring, ED     Status: Abnormal   Collection Time: 12/21/20 10:21 PM  Result Value Ref Range   Glucose-Capillary 110 (H) 70 - 99 mg/dL    Comment: Glucose reference range applies only to samples taken after fasting for at least 8 hours.  Basic metabolic panel     Status: Abnormal   Collection Time: 12/22/20  5:24 AM  Result Value Ref Range   Sodium 132 (L) 135 - 145 mmol/L   Potassium 3.8 3.5 - 5.1 mmol/L   Chloride 100 98 - 111 mmol/L   CO2 23 22 - 32 mmol/L   Glucose, Bld 93 70 - 99 mg/dL    Comment: Glucose reference range applies only to samples taken after fasting for at least 8 hours.   BUN 8 8 - 23 mg/dL   Creatinine, Ser 9.24 (L) 0.61 - 1.24 mg/dL   Calcium 8.5 (L) 8.9 - 10.3 mg/dL   GFR, Estimated >26 >83 mL/min    Comment: (NOTE) Calculated using the CKD-EPI Creatinine Equation (2021)    Anion gap 9 5 - 15    Comment: Performed at [facility], [address], [place], [place] [address]  CBC     Status: Abnormal   Collection Time: 12/22/20  5:24 AM  Result Value Ref Range   WBC 10.3 4.0 - 10.5 K/uL   RBC 4.09 (L) 4.22 - 5.81 MIL/uL   Hemoglobin 13.8 13.0 - 17.0 g/dL   HCT 22.9 79.8 - 92.1 %   MCV 98.0 80.0 - 100.0 fL   MCH 33.7 26.0 - 34.0 pg   MCHC 34.4 30.0 - 36.0 g/dL   RDW 19.4 17.4 - 08.1 %   Platelets 159 150 - 400 K/uL   nRBC  0.0 0.0 - 0.2 %    Comment: Performed at [facility], [address] [place], [place] [address]    DG Shoulder Right  Result Date:  12/21/2020 CLINICAL DATA:  Status post fall. EXAM: RIGHT SHOULDER - 2+ VIEW COMPARISON:  None. FINDINGS: There is no evidence of acute fracture or dislocation. Multiple chronic right-sided rib fractures are noted. There is no evidence of arthropathy. Mild chronic bowing of the right humeral shaft is seen. Soft tissues are unremarkable. IMPRESSION: No acute osseous abnormality. Electronically Signed   By: Aram Candela M.D.   On: 12/21/2020 21:06   DG Knee Complete 4 Views Right  Result Date: 12/21/2020 CLINICAL DATA:  Recent fall with knee pain, initial encounter EXAM: RIGHT KNEE - COMPLETE 4+ VIEW COMPARISON:  None. FINDINGS: No evidence of fracture, dislocation, or joint effusion. No evidence of arthropathy or other focal bone abnormality. Soft tissues are unremarkable. IMPRESSION: No acute abnormality noted. Electronically Signed   By: Alcide Clever M.D.   On: 12/21/2020 21:03   DG Hip Unilat W or Wo Pelvis 2-3 Views Right  Result Date: 12/21/2020 CLINICAL DATA:  Status post fall. EXAM: DG HIP (WITH OR WITHOUT PELVIS) 2-3V RIGHT COMPARISON:  None. FINDINGS: An acute, comminuted fracture deformity is seen extending through the inter trochanteric region of the proximal right femur. There is no evidence of dislocation. Soft tissue swelling is seen along the lateral aspect of the right hip. IMPRESSION: Acute fracture of the proximal right femur. Electronically Signed   By: Aram Candela M.D.   On: 12/21/2020 21:07    Review of Systems no recent fever, chills, nausea, vomiting or changes in his appetite.  Frequent falls as reported above.  10 system review was otherwise normal. Blood pressure 136/70, pulse 82, temperature 97.7 F (36.5 C), temperature source Oral, resp. rate 18, height 5\' 8"  (1.727 m), weight 63.5 kg, SpO2 93 %. Physical Exam Thin relatively cachectic appearing male in no apparent distress.  Alert and oriented x4.  Extraocular motions are intact.  Respirations are unlabored.  Right  lower extremity is somewhat shortened and slightly externally rotated.  No swelling or effusion at the right knee.  No tenderness to palpation.  Right foot has intact sensibility to light touch dorsally and plantarly.  5 out of 5 strength in plantarflexion, dorsiflexion, inversion and eversion.  2+ dorsalis pedis pulse.  No lymphadenopathy.   Assessment/Plan: Right hip intertrochanteric fracture -to the operating room today for open treatment with intramedullary nailing.  I explained the nature of the injury to the patient in detail.  She is much more concerned about his right knee pain than his fractured right hip.  Have counseled him that we can evaluate his chronic right knee pain as he is recovering from treatment of his hip fracture.  The risks and benefits of the alternative treatment options have been discussed in detail.  The patient wishes to proceed with surgery and specifically understands risks of bleeding, infection, nerve damage, blood clots, need for additional surgery, amputation and death.   09-Jan-2021, 7:34 AM

## 2020-12-22 NOTE — Anesthesia Postprocedure Evaluation (Signed)
Anesthesia Post Note  Patient: Raymond Quinn  Procedure(s) Performed: INTRAMEDULLARY (IM) NAIL INTERTROCHANTRIC (Right Hip)     Patient location during evaluation: PACU Anesthesia Type: General Level of consciousness: awake and alert Pain management: pain level controlled Vital Signs Assessment: post-procedure vital signs reviewed and stable Respiratory status: spontaneous breathing, nonlabored ventilation and respiratory function stable Cardiovascular status: blood pressure returned to baseline and stable Postop Assessment: no apparent nausea or vomiting Anesthetic complications: no   No complications documented.  Last Vitals:  Vitals:   12/22/20 1245 12/22/20 1323  BP: (!) 159/101 (!) 152/87  Pulse: 73 74  Resp: 15 18  Temp: (!) 36.4 C 36.8 C  SpO2: 99% 99%    Last Pain:  Vitals:   12/22/20 1323  TempSrc: Oral  PainSc:                  Lowella Curb

## 2020-12-22 NOTE — Progress Notes (Signed)
PROGRESS NOTE    Raymond Quinn  KDT:[id number] DOB: 10-Jul-1951 DOA: 12/21/2020 PCP: Default, Provider, MD   Brief Narrative:  Raymond Quinn is a 70 y.o. male with medical history significant for TBI with history of subarachnoid hemorrhage, EtOH abuse and recurrent falls who presents with concerns of fall yesterday with resulting right-sided shoulder, hip and knee pain. Patient reports that he must of fallen "50 times in the past 2 years."  Thinks he gets vertigo, dizziness and is unsteady on his feet.  Yesterday he was just walking from his garage to the yard and fell on his right side and has been unable to bear weight.  Patient reports drinking 1 bottle of wine per day with his last drink about 5 hours prior to ED eval.  Also smokes 3 to 4 cigars a day. In ED: negative shoulder and knee x-ray.  Right hip x-ray shows proximal femur fracture. ED PA Greta Doom case with orthopedic Dr. Victorino Dike who will see patient in consultation tomorrow and advise n.p.o. after midnight.   Assessment & Plan:   Principal Problem:   Femur fracture, right (HCC) Active Problems:   ETOH abuse   Tobacco use   Recurrent falls while walking   Right proximal femur fracture s/p mechanical fall likely secondary to alcohol/intoxication -Recurrent fall likely secondary to heavy alcohol use -Orthopedic Sx following - plan for ORIF later today -Resume diet post surgery -PRN morphine 2mg  q3hr for moderate pain and 0.5mg  q4hr Dilaudid for severe pain   Recurrent falls in the setting of chronic alcohol use High risk for EtOH withdrawal - Follow with PT - Continues to drink - no indication he wants to stop - On CIWA protocol  Tobacco use  - uses 4 cigars daily - nicotine patch   DVT prophylaxis: SCDs Code Status: Full Family Communication: None present  Status is: Inpt  Dispo: The patient is from: Home              Anticipated d/c is to: TBD              Anticipated d/c date is: 48-72h              Patient  currently NOT medically stable for discharge  Consultants:   Ortho  Procedures:   ORIF Planned later today 12/22/20  Antimicrobials:  None   Subjective: No acute issues/events overnight, pain well controlled  Objective: Vitals:   12/21/20 2340 12/21/20 2359 12/22/20 0153 12/22/20 0542  BP:  129/83 125/77 136/70  Pulse: 76 78 77 82  Resp: 14 18 18 18   Temp: 98.3 F (36.8 C) 97.7 F (36.5 C) 97.6 F (36.4 C) 97.7 F (36.5 C)  TempSrc: Oral Oral Oral Oral  SpO2: 94% 95% 94% 93%  Weight:      Height:        Intake/Output Summary (Last 24 hours) at 12/22/2020 0721 Last data filed at 12/22/2020 0600 Gross per 24 hour  Intake 339.78 ml  Output 800 ml  Net -460.22 ml   Filed Weights   12/21/20 1929  Weight: 63.5 kg    Examination:  General exam: Appears calm and comfortable  Respiratory system: Clear to auscultation. Respiratory effort normal. Cardiovascular system: S1 & S2 heard, RRR. No JVD, murmurs, rubs, gallops or clicks. No pedal edema. Gastrointestinal system: Abdomen is nondistended, soft and nontender. No organomegaly or masses felt. Normal bowel sounds heard. Central nervous system: Alert and oriented. No focal neurological deficits. Extremities: Symmetric 5 x 5 power.  Skin: No rashes, lesions or ulcers Psychiatry: Judgement and insight appear normal. Mood & affect appropriate.   Data Reviewed: I have personally reviewed following labs and imaging studies  CBC: Recent Labs  Lab 12/21/20 2149 12/22/20 0524  WBC 13.9* 10.3  NEUTROABS 10.8*  --   HGB 15.1 13.8  HCT 43.5 40.1  MCV 99.3 98.0  PLT 175 159   Basic Metabolic Panel: Recent Labs  Lab 12/21/20 2149 12/22/20 0524  NA 134* 132*  K 3.5 3.8  CL 99 100  CO2 21* 23  GLUCOSE 112* 93  BUN 7* 8  CREATININE 0.54* 0.53*  CALCIUM 8.9 8.5*   GFR: Estimated Creatinine Clearance: 78.3 mL/min (A) (by C-G formula based on SCr of 0.53 mg/dL (L)). Liver Function Tests: No results for input(s):  AST, ALT, ALKPHOS, BILITOT, PROT, ALBUMIN in the last 168 hours. No results for input(s): LIPASE, AMYLASE in the last 168 hours. No results for input(s): AMMONIA in the last 168 hours. Coagulation Profile: No results for input(s): INR, PROTIME in the last 168 hours. Cardiac Enzymes: No results for input(s): CKTOTAL, CKMB, CKMBINDEX, TROPONINI in the last 168 hours. BNP (last 3 results) No results for input(s): PROBNP in the last 8760 hours. HbA1C: No results for input(s): HGBA1C in the last 72 hours. CBG: Recent Labs  Lab 12/21/20 2221  GLUCAP 110*   Lipid Profile: No results for input(s): CHOL, HDL, LDLCALC, TRIG, CHOLHDL, LDLDIRECT in the last 72 hours. Thyroid Function Tests: No results for input(s): TSH, T4TOTAL, FREET4, T3FREE, THYROIDAB in the last 72 hours. Anemia Panel: No results for input(s): VITAMINB12, FOLATE, FERRITIN, TIBC, IRON, RETICCTPCT in the last 72 hours. Sepsis Labs: No results for input(s): PROCALCITON, LATICACIDVEN in the last 168 hours.  No results found for this or any previous visit (from the past 240 hour(s)).       Radiology Studies: DG Shoulder Right  Result Date: 12/21/2020 CLINICAL DATA:  Status post fall. EXAM: RIGHT SHOULDER - 2+ VIEW COMPARISON:  None. FINDINGS: There is no evidence of acute fracture or dislocation. Multiple chronic right-sided rib fractures are noted. There is no evidence of arthropathy. Mild chronic bowing of the right humeral shaft is seen. Soft tissues are unremarkable. IMPRESSION: No acute osseous abnormality. Electronically Signed   By: Aram Candela M.D.   On: 12/21/2020 21:06   DG Knee Complete 4 Views Right  Result Date: 12/21/2020 CLINICAL DATA:  Recent fall with knee pain, initial encounter EXAM: RIGHT KNEE - COMPLETE 4+ VIEW COMPARISON:  None. FINDINGS: No evidence of fracture, dislocation, or joint effusion. No evidence of arthropathy or other focal bone abnormality. Soft tissues are unremarkable. IMPRESSION: No  acute abnormality noted. Electronically Signed   By: Alcide Clever M.D.   On: 12/21/2020 21:03   DG Hip Unilat W or Wo Pelvis 2-3 Views Right  Result Date: 12/21/2020 CLINICAL DATA:  Status post fall. EXAM: DG HIP (WITH OR WITHOUT PELVIS) 2-3V RIGHT COMPARISON:  None. FINDINGS: An acute, comminuted fracture deformity is seen extending through the inter trochanteric region of the proximal right femur. There is no evidence of dislocation. Soft tissue swelling is seen along the lateral aspect of the right hip. IMPRESSION: Acute fracture of the proximal right femur. Electronically Signed   By: Aram Candela M.D.   On: 12/21/2020 21:07    Scheduled Meds: . LORazepam  0-4 mg Intravenous Q6H   Or  . LORazepam  0-4 mg Oral Q6H  . [START ON 12/24/2020] LORazepam  0-4 mg Intravenous  Q12H   Or  . [START ON 12/24/2020] LORazepam  0-4 mg Oral Q12H  . nicotine  21 mg Transdermal Daily  . thiamine  100 mg Oral Daily   Or  . thiamine  100 mg Intravenous Daily   Continuous Infusions: . sodium chloride 75 mL/hr at 12/22/20 0128     LOS: 1 day   Time spent:  Azucena Fallen, DO [facility]  If 7PM-7AM, please contact night-coverage [email or web address]  12/22/2020, 7:21 AM

## 2020-12-22 NOTE — Anesthesia Preprocedure Evaluation (Addendum)
Anesthesia Evaluation  Patient identified by MRN, date of birth, ID band Patient awake    Reviewed: Allergy & Precautions, NPO status , Patient's Chart, lab work & pertinent test results  Airway Mallampati: II  TM Distance: >3 FB Neck ROM: Full    Dental  (+) Edentulous Upper, Poor Dentition, Loose   Pulmonary neg pulmonary ROS, Current Smoker and Patient abstained from smoking.,    Pulmonary exam normal breath sounds clear to auscultation       Cardiovascular negative cardio ROS Normal cardiovascular exam Rhythm:Regular Rate:Normal     Neuro/Psych negative neurological ROS  negative psych ROS   GI/Hepatic negative GI ROS, (+)     substance abuse  alcohol use,   Endo/Other  negative endocrine ROS  Renal/GU negative Renal ROS  negative genitourinary   Musculoskeletal negative musculoskeletal ROS (+)   Abdominal   Peds negative pediatric ROS (+)  Hematology negative hematology ROS (+)   Anesthesia Other Findings   Reproductive/Obstetrics negative OB ROS                            Anesthesia Physical Anesthesia Plan  ASA: II  Anesthesia Plan: General   Post-op Pain Management:    Induction: Intravenous  PONV Risk Score and Plan: 1 and Ondansetron and Treatment may vary due to age or medical condition  Airway Management Planned: Oral ETT  Additional Equipment:   Intra-op Plan:   Post-operative Plan: Extubation in OR  Informed Consent: I have reviewed the patients History and Physical, chart, labs and discussed the procedure including the risks, benefits and alternatives for the proposed anesthesia with the patient or authorized representative who has indicated his/her understanding and acceptance.     Dental advisory given  Plan Discussed with: CRNA  Anesthesia Plan Comments:         Anesthesia Quick Evaluation

## 2020-12-22 NOTE — Anesthesia Procedure Notes (Signed)
Procedure Name: Intubation Date/Time: 12/22/2020 11:04 AM Performed by: Eben Burow, CRNA Pre-anesthesia Checklist: Patient identified, Emergency Drugs available, Suction available, Patient being monitored and Timeout performed Patient Re-evaluated:Patient Re-evaluated prior to induction Oxygen Delivery Method: Circle system utilized Preoxygenation: Pre-oxygenation with 100% oxygen Induction Type: IV induction Ventilation: Mask ventilation without difficulty Laryngoscope Size: Mac and 4 Grade View: Grade II Tube type: Oral Tube size: 7.5 mm Number of attempts: 1 Airway Equipment and Method: Stylet Placement Confirmation: ETT inserted through vocal cords under direct vision,  positive ETCO2 and breath sounds checked- equal and bilateral Secured at: 23 cm Tube secured with: Tape Dental Injury: Teeth and Oropharynx as per pre-operative assessment

## 2020-12-23 MED ORDER — ADULT MULTIVITAMIN W/MINERALS CH
1.0000 | ORAL_TABLET | Freq: Every day | ORAL | Status: DC
  Administered 2020-12-23 – 2020-12-24 (×2): 1 via ORAL
  Filled 2020-12-23: qty 1

## 2020-12-23 MED ORDER — MELATONIN 5 MG PO TABS
5.0000 mg | ORAL_TABLET | Freq: Every day | ORAL | Status: DC
  Administered 2020-12-23: 5 mg via ORAL
  Filled 2020-12-23: qty 1

## 2020-12-23 MED ORDER — ENSURE ENLIVE PO LIQD
237.0000 mL | Freq: Two times a day (BID) | ORAL | Status: DC
  Administered 2020-12-24 (×2): 237 mL via ORAL

## 2020-12-23 NOTE — Evaluation (Signed)
Physical Therapy Evaluation Patient Details Name: Raymond Quinn MRN: [id number] DOB: 13-Jun-1951 Today's Date: 12/23/2020   History of Present Illness  Pt to ED c/o knee pain(chronic problem per pt but wtih no significant findings on imaging) and found to have R hip fx.  Pt now s/p IM nailing.  Pt admits to 50 falls in last 10 years including one causing subarachnoid hemorrage.  Pt also reports "vestibular problem".  Clinical Impression  Pt admitted as above and presenting with functional mobility limitations 2 decreased R LE strength/ROM, post op R hip and knee pain; and ambulatory balance deficits.  Pt should progress to dc home with assist of son.    Follow Up Recommendations Home health PT    Equipment Recommendations  Rolling walker with 5" wheels    Recommendations for Other Services       Precautions / Restrictions Precautions Precautions: Fall Restrictions Weight Bearing Restrictions: No Other Position/Activity Restrictions: WBAT      Mobility  Bed Mobility Overal bed mobility: Needs Assistance Bed Mobility: Supine to Sit     Supine to sit: Min assist     General bed mobility comments: increased time with multimodal cues for sequence and use of L LE to self assist.    Transfers Overall transfer level: Needs assistance Equipment used: Rolling walker (2 wheeled) Transfers: Sit to/from Stand Sit to Stand: Min assist;Mod assist         General transfer comment: cues for LE management and use of UEs to self assist  Ambulation/Gait Ambulation/Gait assistance: Min assist Gait Distance (Feet): 52 Feet Assistive device: Rolling walker (2 wheeled) Gait Pattern/deviations: Step-to pattern;Decreased step length - right;Decreased step length - left;Shuffle;Trunk flexed Gait velocity: decr   General Gait Details: cues for posture, sequence and position from Raymond Quinn Mobility    Modified Rankin (Stroke Patients Only)        Balance Overall balance assessment: Needs assistance Sitting-balance support: No upper extremity supported;Feet supported Sitting balance-Leahy Scale: Good     Standing balance support: Bilateral upper extremity supported Standing balance-Leahy Scale: Poor                               Pertinent Vitals/Pain Pain Assessment: 0-10 Pain Score: 7  Pain Location: R hip and knee Pain Descriptors / Indicators: Aching;Sore Pain Intervention(s): Limited activity within patient's tolerance;Monitored during session;Premedicated before session;Ice applied    Home Living Family/patient expects to be discharged to:: Private residence Living Arrangements: Children Available Help at Discharge: Family;Friend(s) Type of Home: House Home Access: Stairs to enter Entrance Stairs-Rails: Right Entrance Stairs-Number of Steps: 5 Home Layout: Two level;Able to live on main level with bedroom/bathroom Home Equipment: Walker - 4 wheels;Shower seat - built in Additional Comments: Lives with youngest son    Prior Function Level of Independence: Independent;Independent with assistive device(s)         Comments: States used Rollator for stability to go for mail.     Hand Dominance   Dominant Hand: Right    Extremity/Trunk Assessment   Upper Extremity Assessment Upper Extremity Assessment: Overall WFL for tasks assessed    Lower Extremity Assessment Lower Extremity Assessment: RLE deficits/detail RLE Deficits / Details: AAROM at hip to 75 flex and 15 abd; AAROM at knee to 45.  Pt very cautious regarding knee movement stating it doesnt bend and was suprised that it did.  Cervical / Trunk Assessment Cervical / Trunk Assessment: Normal  Communication   Communication: No difficulties (very talkative)  Cognition Arousal/Alertness: Awake/alert Behavior During Therapy: WFL for tasks assessed/performed;Impulsive Overall Cognitive Status: Within Functional Limits for tasks  assessed                                        General Comments      Exercises General Exercises - Lower Extremity Ankle Circles/Pumps: AROM;Both;15 reps;Supine Quad Sets: AROM;Both;10 reps;Supine Heel Slides: AAROM;Right;15 reps;Supine Hip ABduction/ADduction: AAROM;Right;15 reps;Supine   Assessment/Plan    PT Assessment Patient needs continued PT services  PT Problem List Decreased strength;Decreased range of motion;Decreased activity tolerance;Decreased balance;Decreased mobility;Decreased knowledge of use of DME;Pain;Decreased safety awareness       PT Treatment Interventions DME instruction;Gait training;Stair training;Functional mobility training;Therapeutic activities;Therapeutic exercise;Balance training;Patient/family education    PT Goals (Current goals can be found in the Care Plan section)  Acute Rehab PT Goals Patient Stated Goal: Regain IND PT Goal Formulation: With patient Time For Goal Achievement: 12/30/20 Potential to Achieve Goals: Good    Frequency Min 5X/week   Barriers to discharge        Co-evaluation               AM-PAC PT "6 Clicks" Mobility  Outcome Measure Help needed turning from your back to your side while in a flat bed without using bedrails?: A Lot Help needed moving from lying on your back to sitting on the side of a flat bed without using bedrails?: A Little Help needed moving to and from a bed to a chair (including a wheelchair)?: A Little Help needed standing up from a chair using your arms (e.g., wheelchair or bedside chair)?: A Little Help needed to walk in hospital room?: A Little Help needed climbing 3-5 steps with a railing? : A Lot 6 Click Score: 16    End of Session Equipment Utilized During Treatment: Gait belt Activity Tolerance: Patient tolerated treatment well Patient left: in chair;with call bell/phone within reach Nurse Communication: Mobility status PT Visit Diagnosis: Unsteadiness on feet  (R26.81);History of falling (Z91.81);Difficulty in walking, not elsewhere classified (R26.2);Pain Pain - Right/Left: Right Pain - part of body: Hip;Knee    Time: 3335-4562 PT Time Calculation (min) (ACUTE ONLY): 41 min   Charges:   PT Evaluation $PT Eval Low Complexity: 1 Low PT Treatments $Gait Training: 8-22 mins $Therapeutic Exercise: 8-22 mins        Mauro Kaufmann PT Acute Rehabilitation Services Pager [phone] Office [phone]   [facility] 12/23/2020, 4:33 PM

## 2020-12-23 NOTE — Discharge Instructions (Signed)
Toni Arthurs, MD [facility]  Please read the following information regarding your care after surgery.  Medications  You only need a prescription for the narcotic pain medicine (ex. oxycodone, Percocet, Norco).  All of the other medicines listed below are available over the counter. X Aleve 2 pills twice a day for the first 3 days after surgery. X acetominophen (Tylenol) 650 mg every 4-6 hours as you need for minor to moderate pain X oxycodone as prescribed for severe pain  Narcotic pain medicine (ex. oxycodone, Percocet, Vicodin) will cause constipation.  To prevent this problem, take the following medicines while you are taking any pain medicine. X docusate sodium (Colace) 100 mg twice a day X senna (Senokot) 2 tablets twice a day   Weight Bearing X Bear weight when you are able on your operated leg or foot.   Cast / Splint / Dressing X Keep your splint, cast or dressing clean and dry.  Don't put anything (coat hanger, pencil, etc) down inside of it.  If it gets damp, use a hair dryer on the cool setting to dry it.  If it gets soaked, call the office to schedule an appointment for a cast change.   After your dressing, cast or splint is removed; you may shower, but do not soak or scrub the wound.  Allow the water to run over it, and then gently pat it dry.  Swelling It is normal for you to have swelling where you had surgery.  To reduce swelling and pain, keep your toes above your nose for at least 3 days after surgery.  It may be necessary to keep your foot or leg elevated for several weeks.  If it hurts, it should be elevated.  Follow Up Call my office at [phone] when you are discharged from the hospital or surgery center to schedule an appointment to be seen two weeks after surgery.  Call my office at [phone] if you develop a fever >101.5 F, nausea, vomiting, bleeding from the surgical site or severe pain.

## 2020-12-23 NOTE — Progress Notes (Signed)
Initial Nutrition Assessment  DOCUMENTATION CODES:   Not applicable  INTERVENTION:    Ensure Enlive po BID, each supplement provides 350 kcal and 20 grams of protein  MVI daily   NUTRITION DIAGNOSIS:   Increased nutrient needs related to post-op healing as evidenced by estimated needs.  GOAL:   Patient will meet greater than or equal to 90% of their needs  MONITOR:   PO intake,Supplement acceptance,Weight trends,Labs,I & O's  REASON FOR ASSESSMENT:   Malnutrition Screening Tool    ASSESSMENT:   Patient with PMH significant for TBI with SAH, ETOH use, and recurrent falls. Presents this admission after mechanical fall resulting in R hip fracture.  5/7- s/p IM nailing R hip  Attempted to reach patient. No answer. Last two meal completions charted as 25% and 50%. Per notes patient has history of ETOH use. When asked patient reports he drinks a glass of wine at night. Continue thiamine supplementation, add folic acid. RD to provide high kcal high protein supplement to support post op healing.   Records lack weight history over the last year. Admission weight looks to be stated. Will need to obtain actual weight to assess for weight loss.   UOP: 1575 ml x 24 hrs   Drips: D5 in 1/2 NS with 20 mEq KCl @ 75 ml/hr  Medications: colace, thiamine Labs: Na 132 (L)   Diet Order:   Diet Order            Diet regular Room service appropriate? Yes; Fluid consistency: Thin  Diet effective now                 EDUCATION NEEDS:   Not appropriate for education at this time  Skin:  Skin Assessment: Skin Integrity Issues: Skin Integrity Issues:: Incisions Incisions: R leg  Last BM:  PTA  Height:   Ht Readings from Last 1 Encounters:  12/21/20 5\' 8"  (1.727 m)    Weight:   Wt Readings from Last 1 Encounters:  12/21/20 63.5 kg    BMI:  Body mass index is 21.29 kg/m.  Estimated Nutritional Needs:   Kcal:  1900-2100 kcal  Protein:  95-115 grams  Fluid:  >/=  1.9 L/day  02/20/21 RD, LDN Clinical Nutrition Pager listed in AMION

## 2020-12-23 NOTE — Progress Notes (Signed)
Subjective: 1 Day Post-Op Procedure(s) (LRB): INTRAMEDULLARY (IM) NAIL INTERTROCHANTRIC (Right)  Patient reports pain as moderate.  Reports that his R knee is more painful that his hip this morning. Denies fever, chills, N/V, CP, SOB.  Tolerating POs well.  Reports that he is hopeful to go home. Objective:   VITALS:  Temp:  [97.5 F (36.4 C)-98.6 F (37 C)] 98.1 F (36.7 C) (05/08 0526) Pulse Rate:  [66-92] 87 (05/08 0952) Resp:  [13-19] 18 (05/08 0952) BP: (124-159)/(71-101) 125/79 (05/08 0952) SpO2:  [92 %-100 %] 94 % (05/08 0952)  General: WDWN patient in NAD. Psych:  Appropriate mood and affect. Neuro:  A&O x 3, Moving all extremities, sensation intact to light touch HEENT:  EOMs intact Chest:  Even non-labored respirations Skin:  Dressing C/D/I, no rashes or lesions Extremities: warm/dry, mild edema to R hip/thigh, no erythema or echymosis.  No lymphadenopathy. Pulses: Popliteus 2+ MSK:  ROM: lacks 5 degrees TKE, MMT: able to perform quad set,     LABS Recent Labs    12/21/20 2149 12/22/20 0524  HGB 15.1 13.8  WBC 13.9* 10.3  PLT 175 159   Recent Labs    12/21/20 2149 12/22/20 0524  NA 134* 132*  K 3.5 3.8  CL 99 100  CO2 21* 23  BUN 7* 8  CREATININE 0.54* 0.53*  GLUCOSE 112* 93   No results for input(s): LABPT, INR in the last 72 hours.   Assessment/Plan: 1 Day Post-Op Procedure(s) (LRB): INTRAMEDULLARY (IM) NAIL INTERTROCHANTRIC (Right)  WBAT R LE Up with therapy Disp: pending His R knee pain is chronic.  Plan to have this evaluated by one of our knee specialists in the outpatient setting. Plan for 2 week outpatient post-op visit   Joana [facility] Office:  [phone]
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
## 2020-12-23 NOTE — Progress Notes (Signed)
PROGRESS NOTE    Raymond Quinn  ZOX:[id number] DOB: 11/30/1950 DOA: 12/21/2020 PCP: Default, Provider, MD   Brief Narrative:  Raymond Quinn is a 70 y.o. male with medical history significant for TBI with history of subarachnoid hemorrhage, EtOH abuse and recurrent falls who presents with concerns of fall yesterday with resulting right-sided shoulder, hip and knee pain. Patient reports that he must of fallen "50 times in the past 2 years."  Thinks he gets vertigo, dizziness and is unsteady on his feet.  Yesterday he was just walking from his garage to the yard and fell on his right side and has been unable to bear weight.  Patient reports drinking 1 bottle of wine per day with his last drink about 5 hours prior to ED eval.  Also smokes 3 to 4 cigars a day. In ED: negative shoulder and knee x-ray.  Right hip x-ray shows proximal femur fracture. ED PA Greta DoomBowie case with orthopedic Dr. Victorino DikeHewitt who will see patient in consultation tomorrow and advise n.p.o. after midnight.   Assessment & Plan:   Principal Problem:   Femur fracture, right (HCC) Active Problems:   ETOH abuse   Tobacco use   Recurrent falls while walking   Right proximal femur fracture s/p mechanical fall likely secondary to alcohol/intoxication S/P IM nail placement (Right) 12/22/20 -Recurrent fall likely secondary to heavy alcohol use -Orthopedic Sx following - plan for ORIF later today -Continue diet - advance as tolerated -PRN morphine 2mg  q3hr for moderate pain and 0.5mg  q4hr Dilaudid for severe pain   Recurrent falls in the setting of chronic alcohol use High risk for EtOH withdrawal - Follow with PT - Continues to drink - no indication he wants to stop - On CIWA protocol  Tobacco use  - uses 4 cigars daily - nicotine patch   DVT prophylaxis: SCDs Code Status: Full Family Communication: None present  Status is: Inpt  Dispo: The patient is from: Home              Anticipated d/c is to: TBD               Anticipated d/c date is: 48-72h              Patient currently NOT medically stable for discharge  Consultants:   Ortho  Procedures:   IM nail to R femur 12/22/20  Antimicrobials:  None   Subjective: No acute issues/events overnight, pain well controlled  Objective: Vitals:   12/22/20 1830 12/22/20 2135 12/23/20 0149 12/23/20 0526  BP: 135/82 (!) 142/77 126/76 124/76  Pulse: 84 79 78 78  Resp: 18 17 15 18   Temp: (!) 97.5 F (36.4 C) (!) 97.5 F (36.4 C) 98.6 F (37 C) 98.1 F (36.7 C)  TempSrc: Oral Oral Oral Oral  SpO2: 95% 96% 95% 94%  Weight:      Height:        Intake/Output Summary (Last 24 hours) at 12/23/2020 [id number] Last data filed at 12/23/2020 0600 Gross per 24 hour  Intake 2003.49 ml  Output 1625 ml  Net 378.49 ml   Filed Weights   12/21/20 1929  Weight: 63.5 kg    Examination:  General exam: Appears calm and comfortable  Respiratory system: Clear to auscultation. Respiratory effort normal. Cardiovascular system: S1 & S2 heard, RRR. No JVD, murmurs, rubs, gallops or clicks. No pedal edema. Gastrointestinal system: Abdomen is nondistended, soft and nontender. No organomegaly or masses felt. Normal bowel sounds heard. Central nervous system: Alert  and oriented. No focal neurological deficits. Extremities: R leg pain, limited ROM; bandage clean/dry/intact Skin: No rashes, lesions or ulcers Psychiatry: Judgement and insight appear normal. Mood & affect appropriate.   Data Reviewed: I have personally reviewed following labs and imaging studies  CBC: Recent Labs  Lab 12/21/20 2149 12/22/20 0524  WBC 13.9* 10.3  NEUTROABS 10.8*  --   HGB 15.1 13.8  HCT 43.5 40.1  MCV 99.3 98.0  PLT 175 159   Basic Metabolic Panel: Recent Labs  Lab 12/21/20 2149 12/22/20 0524  NA 134* 132*  K 3.5 3.8  CL 99 100  CO2 21* 23  GLUCOSE 112* 93  BUN 7* 8  CREATININE 0.54* 0.53*  CALCIUM 8.9 8.5*   GFR: Estimated Creatinine Clearance: 78.3 mL/min (A) (by C-G  formula based on SCr of 0.53 mg/dL (L)). Liver Function Tests: No results for input(s): AST, ALT, ALKPHOS, BILITOT, PROT, ALBUMIN in the last 168 hours. No results for input(s): LIPASE, AMYLASE in the last 168 hours. No results for input(s): AMMONIA in the last 168 hours. Coagulation Profile: No results for input(s): INR, PROTIME in the last 168 hours. Cardiac Enzymes: No results for input(s): CKTOTAL, CKMB, CKMBINDEX, TROPONINI in the last 168 hours. BNP (last 3 results) No results for input(s): PROBNP in the last 8760 hours. HbA1C: No results for input(s): HGBA1C in the last 72 hours. CBG: Recent Labs  Lab 12/21/20 2221  GLUCAP 110*   Lipid Profile: No results for input(s): CHOL, HDL, LDLCALC, TRIG, CHOLHDL, LDLDIRECT in the last 72 hours. Thyroid Function Tests: No results for input(s): TSH, T4TOTAL, FREET4, T3FREE, THYROIDAB in the last 72 hours. Anemia Panel: No results for input(s): VITAMINB12, FOLATE, FERRITIN, TIBC, IRON, RETICCTPCT in the last 72 hours. Sepsis Labs: No results for input(s): PROCALCITON, LATICACIDVEN in the last 168 hours.  Recent Results (from the past 240 hour(s))  SARS CORONAVIRUS 2 (TAT 6-24 HRS) Nasopharyngeal Nasopharyngeal Swab     Status: None   Collection Time: 12/21/20  9:50 PM   Specimen: Nasopharyngeal Swab  Result Value Ref Range Status   SARS Coronavirus 2 NEGATIVE NEGATIVE Final    Comment: (NOTE) SARS-CoV-2 target nucleic acids are NOT DETECTED.  The SARS-CoV-2 RNA is generally detectable in upper and lower respiratory specimens during the acute phase of infection. Negative results do not preclude SARS-CoV-2 infection, do not rule out co-infections with other pathogens, and should not be used as the sole basis for treatment or other patient management decisions. Negative results must be combined with clinical observations, patient history, and epidemiological information. The expected result is Negative.  Fact Sheet for  Patients: HairSlick.no  Fact Sheet for Healthcare Providers: quierodirigir.com  This test is not yet approved or cleared by the [place] FDA and  has been authorized for detection and/or diagnosis of SARS-CoV-2 by FDA under an Emergency Use Authorization (EUA). This EUA will remain  in effect (meaning this test can be used) for the duration of the COVID-19 declaration under Se ction 564(b)(1) of the Act, 21 U.S.C. section 360bbb-3(b)(1), unless the authorization is terminated or revoked sooner.  Performed at [facility], [address] [place], [place] [address]   Resp Panel by RT-PCR (Flu A&B, Covid) Nasal Mucosa     Status: None   Collection Time: 12/22/20  8:09 AM   Specimen: Nasal Mucosa; Nasopharyngeal(NP) swabs in vial transport medium  Result Value Ref Range Status   SARS Coronavirus 2 by RT PCR NEGATIVE NEGATIVE Final    Comment: (  NOTE) SARS-CoV-2 target nucleic acids are NOT DETECTED.  The SARS-CoV-2 RNA is generally detectable in upper respiratory specimens during the acute phase of infection. The lowest concentration of SARS-CoV-2 viral copies this assay can detect is 138 copies/mL. A negative result does not preclude SARS-Cov-2 infection and should not be used as the sole basis for treatment or other patient management decisions. A negative result may occur with  improper specimen collection/handling, submission of specimen other than nasopharyngeal swab, presence of viral mutation(s) within the areas targeted by this assay, and inadequate number of viral copies(<138 copies/mL). A negative result must be combined with clinical observations, patient history, and epidemiological information. The expected result is Negative.  Fact Sheet for Patients:  BloggerCourse.com  Fact Sheet for Healthcare Providers:  SeriousBroker.it  This test is no t yet  approved or cleared by the [place] FDA and  has been authorized for detection and/or diagnosis of SARS-CoV-2 by FDA under an Emergency Use Authorization (EUA). This EUA will remain  in effect (meaning this test can be used) for the duration of the COVID-19 declaration under Section 564(b)(1) of the Act, 21 U.S.C.section 360bbb-3(b)(1), unless the authorization is terminated  or revoked sooner.       Influenza A by PCR NEGATIVE NEGATIVE Final   Influenza B by PCR NEGATIVE NEGATIVE Final    Comment: (NOTE) The Xpert Xpress SARS-CoV-2/FLU/RSV plus assay is intended as an aid in the diagnosis of influenza from Nasopharyngeal swab specimens and should not be used as a sole basis for treatment. Nasal washings and aspirates are unacceptable for Xpert Xpress SARS-CoV-2/FLU/RSV testing.  Fact Sheet for Patients: BloggerCourse.com  Fact Sheet for Healthcare Providers: SeriousBroker.it  This test is not yet approved or cleared by the [place] FDA and has been authorized for detection and/or diagnosis of SARS-CoV-2 by FDA under an Emergency Use Authorization (EUA). This EUA will remain in effect (meaning this test can be used) for the duration of the COVID-19 declaration under Section 564(b)(1) of the Act, 21 U.S.C. section 360bbb-3(b)(1), unless the authorization is terminated or revoked.  Performed at [facility], [address], [place], [place] [address]   Surgical pcr screen     Status: None   Collection Time: 12/22/20  8:23 AM   Specimen: Nasal Mucosa; Nasal Swab  Result Value Ref Range Status   MRSA, PCR NEGATIVE NEGATIVE Final   Staphylococcus aureus NEGATIVE NEGATIVE Final    Comment: (NOTE) The Xpert SA Assay (FDA approved for NASAL specimens in patients 76 years of age and older), is one component of a comprehensive surveillance program. It is not intended to diagnose infection nor to guide or  monitor treatment. Performed at [facility], [address], [place], [place] [address]          Radiology Studies: DG Shoulder Right  Result Date: 12/21/2020 CLINICAL DATA:  Status post fall. EXAM: RIGHT SHOULDER - 2+ VIEW COMPARISON:  None. FINDINGS: There is no evidence of acute fracture or dislocation. Multiple chronic right-sided rib fractures are noted. There is no evidence of arthropathy. Mild chronic bowing of the right humeral shaft is seen. Soft tissues are unremarkable. IMPRESSION: No acute osseous abnormality. Electronically Signed   By: Aram Candela M.D.   On: 12/21/2020 21:06   DG Knee Complete 4 Views Right  Result Date: 12/21/2020 CLINICAL DATA:  Recent fall with knee pain, initial encounter EXAM: RIGHT KNEE - COMPLETE 4+ VIEW COMPARISON:  None. FINDINGS: No evidence of fracture, dislocation, or  joint effusion. No evidence of arthropathy or other focal bone abnormality. Soft tissues are unremarkable. IMPRESSION: No acute abnormality noted. Electronically Signed   By: Alcide Clever M.D.   On: 12/21/2020 21:03   DG C-Arm 1-60 Min-No Report  Result Date: 12/22/2020 Fluoroscopy was utilized by the requesting physician.  No radiographic interpretation.   DG HIP OPERATIVE UNILAT W OR W/O PELVIS RIGHT  Result Date: 12/22/2020 CLINICAL DATA:  Operative fixation of a right hip fracture. EXAM: OPERATIVE RIGHT HIP (WITH PELVIS IF PERFORMED) 4 VIEWS TECHNIQUE: Fluoroscopic spot image(s) were submitted for interpretation post-operatively. COMPARISON:  12/21/2020 FINDINGS: Compression screw and rod of the recently demonstrated right intertrochanteric fracture with anatomic position and alignment. IMPRESSION: Operative fixation of the recently demonstrated right intertrochanteric fracture. Electronically Signed   By: Beckie Salts M.D.   On: 12/22/2020 14:15   DG Hip Unilat W or Wo Pelvis 2-3 Views Right  Result Date: 12/21/2020 CLINICAL DATA:  Status post fall. EXAM:  DG HIP (WITH OR WITHOUT PELVIS) 2-3V RIGHT COMPARISON:  None. FINDINGS: An acute, comminuted fracture deformity is seen extending through the inter trochanteric region of the proximal right femur. There is no evidence of dislocation. Soft tissue swelling is seen along the lateral aspect of the right hip. IMPRESSION: Acute fracture of the proximal right femur. Electronically Signed   By: Aram Candela M.D.   On: 12/21/2020 21:07    Scheduled Meds: . docusate sodium  100 mg Oral BID  . enoxaparin (LOVENOX) injection  40 mg Subcutaneous Q24H  . LORazepam  0-4 mg Intravenous Q6H   Or  . LORazepam  0-4 mg Oral Q6H  . [START ON 12/24/2020] LORazepam  0-4 mg Intravenous Q12H   Or  . [START ON 12/24/2020] LORazepam  0-4 mg Oral Q12H  . nicotine  21 mg Transdermal Daily  . thiamine  100 mg Oral Daily   Or  . thiamine  100 mg Intravenous Daily   Continuous Infusions: . dextrose 5 % and 0.45 % NaCl with KCl 20 mEq/L 75 mL/hr at 12/23/20 0200     LOS: 2 days   Time spent:  Azucena Fallen, DO [facility]  If 7PM-7AM, please contact night-coverage [email or web address]  12/23/2020, 7:12 AM

## 2020-12-24 LAB — CBC
HCT: 35.7 % — ABNORMAL LOW (ref 39.0–52.0)
Hemoglobin: 12.3 g/dL — ABNORMAL LOW (ref 13.0–17.0)
MCH: 34.5 pg — ABNORMAL HIGH (ref 26.0–34.0)
MCHC: 34.5 g/dL (ref 30.0–36.0)
MCV: 100 fL (ref 80.0–100.0)
Platelets: 168 10*3/uL (ref 150–400)
RBC: 3.57 MIL/uL — ABNORMAL LOW (ref 4.22–5.81)
RDW: 12.3 % (ref 11.5–15.5)
WBC: 10 10*3/uL (ref 4.0–10.5)
nRBC: 0 % (ref 0.0–0.2)

## 2020-12-24 LAB — BASIC METABOLIC PANEL
Anion gap: 4 — ABNORMAL LOW (ref 5–15)
BUN: 6 mg/dL — ABNORMAL LOW (ref 8–23)
CO2: 30 mmol/L (ref 22–32)
Calcium: 8.9 mg/dL (ref 8.9–10.3)
Chloride: 100 mmol/L (ref 98–111)
Creatinine, Ser: 0.52 mg/dL — ABNORMAL LOW (ref 0.61–1.24)
GFR, Estimated: >60 mL/min (ref >60)
Glucose, Bld: 112 mg/dL — ABNORMAL HIGH (ref 70–99)
Potassium: 3.9 mmol/L (ref 3.5–5.1)
Sodium: 134 mmol/L — ABNORMAL LOW (ref 135–145)

## 2020-12-24 MED ORDER — OXYCODONE HCL 5 MG PO TABS: 5.0000 mg | ORAL_TABLET | ORAL | 0 refills | Status: AC | PRN

## 2020-12-24 MED ORDER — THIAMINE HCL 100 MG PO TABS: 100.0000 mg | ORAL_TABLET | Freq: Every day | ORAL | 0 refills | Status: AC

## 2020-12-24 MED ORDER — RIVAROXABAN 10 MG PO TABS: 10.0000 mg | ORAL_TABLET | Freq: Every day | ORAL | 0 refills | Status: AC

## 2020-12-24 MED ORDER — SENNA 8.6 MG PO TABS: 2.0000 | ORAL_TABLET | Freq: Two times a day (BID) | ORAL | 0 refills | Status: AC

## 2020-12-24 MED ORDER — ADULT MULTIVITAMIN W/MINERALS CH: 1.0000 | ORAL_TABLET | Freq: Every day | ORAL | 0 refills | Status: AC

## 2020-12-24 MED ORDER — DOCUSATE SODIUM 100 MG PO CAPS: 100.0000 mg | ORAL_CAPSULE | Freq: Two times a day (BID) | ORAL | 0 refills | Status: AC

## 2020-12-24 NOTE — Care Management Important Message (Signed)
Medicare IM printed for Social Work team to give to the patient. 

## 2020-12-24 NOTE — Progress Notes (Signed)
Physical Therapy Treatment Patient Details Name: Raymond Quinn MRN: [id number] DOB: Jul 22, 1951 Today's Date: 12/24/2020    History of Present Illness Pt to ED c/o knee pain(chronic problem per pt but wtih no significant findings on imaging) and found to have R hip fx.  Pt now s/p IM nailing.  Pt admits to 50 falls in last 10 years including one causing subarachnoid hemorrage.  Pt also reports "vestibular problem".    PT Comments    POD # 2 Pt progressing slowly.  Assisted with amb in hallway.  General transfer comment: 50% cues for LE management and use of UEs to self assist plus safety with turns.  General Gait Details: 50% VC's on proper walker to self distabce esp with turns.  Pt present with unsteady, ataxic gait with delayed correction responce time.  Heavy lean on walker with decreased stance time R LE.  Choppy Gait.  HIGH FALL RISK. Returned to room and performed a few TE's followed by ICE. Pt lives at home with his Son. Will consult LPT that pt would benefit from CIR prior to returning home.   Follow Up Recommendations  CIR     Equipment Recommendations  Rolling walker with 5" wheels    Recommendations for Other Services       Precautions / Restrictions Precautions Precautions: Fall Precaution Comments: Hx falls/balance Restrictions Weight Bearing Restrictions: No Other Position/Activity Restrictions: WBAT    Mobility  Bed Mobility               General bed mobility comments: OOB in recliner    Transfers   Equipment used: Rolling walker (2 wheeled) Transfers: Sit to/from Stand Sit to Stand: Min assist;Mod assist         General transfer comment: 50% cues for LE management and use of UEs to self assist plus safety with turns  Ambulation/Gait Ambulation/Gait assistance: Min assist;Mod assist Gait Distance (Feet): 47 Feet Assistive device: Rolling walker (2 wheeled) Gait Pattern/deviations: Step-to pattern;Decreased step length - right;Decreased step  length - left;Shuffle;Trunk flexed Gait velocity: decreased   General Gait Details: 50% VC's on proper walker to self distabce esp with turns.  Pt present with unsteady, ataxic gait with delayed correction responce time.  Heavy lean on walker with decreased stance time R LE.  Choppy Gait.  HIGH FALL RISK.   Stairs             Wheelchair Mobility    Modified Rankin (Stroke Patients Only)       Balance                                            Cognition Arousal/Alertness: Awake/alert Behavior During Therapy: WFL for tasks assessed/performed;Impulsive Overall Cognitive Status: Within Functional Limits for tasks assessed                                 General Comments: pleasant AxO x 3 with some ST memory deficits "Oh yeah now I remember"      Exercises   R LE TE's  10 reps ankle pumps 05 reps knee presses 05 reps heel slides 05 reps SAQ's 05 reps ABD Instructed how to use a belt loop to assist  Followed by ICE     General Comments        Pertinent Vitals/Pain Pain Assessment:  0-10 Pain Score: 7  Pain Location: R knee >  R hip Pain Descriptors / Indicators: Aching;Sore Pain Intervention(s): Monitored during session;Premedicated before session;Repositioned;Heat applied    Home Living                      Prior Function            PT Goals (current goals can now be found in the care plan section) Progress towards PT goals: Progressing toward goals    Frequency    Min 5X/week      PT Plan Current plan remains appropriate;Discharge plan needs to be updated    Co-evaluation              AM-PAC PT "6 Clicks" Mobility   Outcome Measure  Help needed turning from your back to your side while in a flat bed without using bedrails?: A Lot Help needed moving from lying on your back to sitting on the side of a flat bed without using bedrails?: A Little Help needed moving to and from a bed to a chair  (including a wheelchair)?: A Little Help needed standing up from a chair using your arms (e.g., wheelchair or bedside chair)?: A Little Help needed to walk in hospital room?: A Lot Help needed climbing 3-5 steps with a railing? : A Lot 6 Click Score: 15    End of Session Equipment Utilized During Treatment: Gait belt Activity Tolerance: Patient tolerated treatment well Patient left: in chair;with call bell/phone within reach Nurse Communication: Mobility status PT Visit Diagnosis: Unsteadiness on feet (R26.81);History of falling (Z91.81);Difficulty in walking, not elsewhere classified (R26.2);Pain Pain - Right/Left: Right Pain - part of body: Hip;Knee     Time: 4656-8127 PT Time Calculation (min) (ACUTE ONLY): 28 min  Charges:  $Gait Training: 8-22 mins $Therapeutic Exercise: 8-22 mins                     Felecia Shelling  PTA Acute  Rehabilitation Services Pager      [phone] Office      [phone]

## 2020-12-24 NOTE — TOC Transition Note (Signed)
Transition of Care [facility]) - CM/SW Discharge Note  Patient Details  Name: Raymond Quinn MRN: [id number] Date of Birth: 1950/10/16  Transition of Care [facility]) CM/SW Contact:  Sherie Don, LCSW Phone Number: 12/24/2020, 2:53 PM  Clinical Narrative: Patient to discharge home today as CIR has declined referral. CSW met with patient to discuss HHPT vs. OPPT. Daughter, Ramiro Harvest, would prefer that patient not discharge home due to concerns about frequent falls. Although daughter has HCPOA, CSW explained patient has capacity and if patient is requesting to discharge home with HHPT, that will be the discharge plan. Patient in need of rolling walker. Patient agreeable to referrals for [facility] and DME. Referral made to [facility] with [facility] and orders have been placed. DME referral made to [facility] with [facility]. [facility] to deliver rolling walker to patient's room. TOC signing off.  Final next level of care: [place] Barriers to Discharge: Barriers Resolved  Patient Goals and CMS Choice Patient states their goals for this hospitalization and ongoing recovery are:: Return to his home CMS Medicare.gov Compare Post Acute Care list provided to:: Patient Choice offered to / list presented to : Patient  Discharge Plan and Services In-house Referral: Clinical Social Work Post Acute Care Choice: Home Health          DME Arranged: Gilford Rile rolling DME Agency: [facility] Date DME Agency Contacted: 12/24/20 Time DME Agency Contacted: [phone] Representative spoke with at DME Agency: Freda Munro [place]: PT [place]: [place] ([place]) Date [place]: 12/24/20 Representative spoke with at [place]: Ramond Marrow  Readmission Risk Interventions No flowsheet data found.

## 2020-12-24 NOTE — Progress Notes (Addendum)
Subjective: 2 Days Post-Op Procedure(s) (LRB): INTRAMEDULLARY (IM) NAIL INTERTROCHANTRIC (Right)  Patient reports pain as mild to moderate.  Tolerating POs well.  Admits to flatus.  Concerned that he hasn't had a BM in a few days.  Denies abdominal pain.  Reports that he worked well with therapy yesterday and is eager to go home with his son.  Objective:   VITALS:  Temp:  [97.3 F (36.3 C)-98.5 F (36.9 C)] 98.5 F (36.9 C) (05/09 0559) Pulse Rate:  [71-129] 71 (05/09 0559) Resp:  [17-18] 18 (05/09 0559) BP: (121-164)/(66-86) 121/66 (05/09 0559) SpO2:  [94 %-100 %] 96 % (05/09 0559)  General: WDWN patient in NAD. Psych:  Appropriate mood and affect. Neuro:  A&O x 3, Moving all extremities, sensation intact to light touch HEENT:  EOMs intact Chest:  Even non-labored respirations Skin:  Dressing C/D/I, no rashes or lesions Extremities: warm/dry, mild edema to right hip/thigh, no erythema or echymosis.  No lymphadenopathy. Pulses: Popliteus 2+ MSK:  ROM: lacks 5 degrees TKE, MMT: able to perform quad set    LABS Recent Labs    12/21/20 2149 12/22/20 0524 12/24/20 0404  HGB 15.1 13.8 12.3*  WBC 13.9* 10.3 10.0  PLT 175 159 168   Recent Labs    12/22/20 0524 12/24/20 0404  NA 132* 134*  K 3.8 3.9  CL 100 100  CO2 23 30  BUN 8 6*  CREATININE 0.53* 0.52*  GLUCOSE 93 112*   No results for input(s): LABPT, INR in the last 72 hours.   Assessment/Plan: 2 Days Post-Op Procedure(s) (LRB): INTRAMEDULLARY (IM) NAIL INTERTROCHANTRIC (Right)  -WBAT R LE -Up with therapy  -Disp:  PT recommends home with HHPT -DVT ppx;  lovenox in house; transition to Xarelto upon D/C -After searching [place] PMP Aware the patient is provided a Rx for oxycodone. -D/C scripts for Xarelto and Oxycodone have been sent to outpatient pharmacy. -Advised patient not to use narcotics if he doesn't need them, as this can eventually result in constipation. -Patient is stable from ortho  perspective. -Plan for 2 week outpatient post-op visit.   Alfredo Martinez PA-C [facility] Office:  [phone]

## 2020-12-24 NOTE — Progress Notes (Signed)
Patient and daughter were given discharge instructions, and all questions were answered. Patient was stable for discharge and was taken to the main exit by wheelchair. 

## 2020-12-24 NOTE — TOC Progression Note (Signed)
Transition of Care [facility]) - Progression Note   Patient Details  Name: Raymond Quinn MRN: [id number] Date of Birth: 1951/06/20  Transition of Care [facility]) CM/SW [place], LCSW Phone Number: 12/24/2020, 1:06 PM  Clinical Narrative: PT evaluation initially recommended HHPT. [facility] are not in-network with [facility] Medicare. CSW met with patient and daughter to discuss recommendations. Per patient, he wants to discharge home, but daughter reported a consult for CIR was sent to have the patient assessed. Tentative HH referral made to [facility] with [facility]. TOC awaiting update regarding CIR vs. HHPT.  Expected Discharge Plan: [place] Barriers to Discharge: Continued Medical Work up  Expected Discharge Plan and Services Expected Discharge Plan: [place] In-house Referral: Clinical Social Work Post Acute Care Choice: [place] arrangements for the past 2 months: [place]: PT [place]: [place] ([facility]) Date [place]: 12/24/20 Representative spoke with at [place]: Ramond Marrow  Readmission Risk Interventions No flowsheet data found.

## 2020-12-24 NOTE — Discharge Summary (Signed)
Physician Discharge Summary  Raymond Quinn ZOX:[id number] DOB: [birth year] DOA: 12/21/2020  PCP: Default, Provider, MD  Admit date: 12/21/2020 Discharge date: 12/24/2020  Admitted From: Home Disposition:  Home  Recommendations for Outpatient Follow-up:  1. Follow up with PCP in 1-2 weeks 2. Please obtain BMP/CBC in one week 3. Please follow up with ortho as scheduled  Home Health: PT  Equipment/Devices: Walker  Discharge Condition: Stable  CODE STATUS:Full  Diet recommendation:  As tolerated  Brief/Interim Summary: Raymond Kocher Gunningis a 70 y.o.malewith medical history significant forTBI with history of subarachnoid hemorrhage, EtOH abuse and recurrent falls who presents with concerns of fall yesterday with resulting right-sided shoulder, hip and knee pain. Patient reports that he must of fallen "50 times in the past 2 years."Thinks he gets vertigo, dizziness and is unsteady on his feet. Yesterday he was just walking from his garage to the yard and fell on his right side and has been unable to bear weight. Patient reports drinking 1 bottle of wine per day with his last drink about 5 hours prior to ED eval. Also smokes 3 to 4 cigars a day. In ED: negative shoulder and knee x-ray. Right hip x-ray shows proximal femur fracture. ED [place] with orthopedic Dr. Victorino Dike who will see patient in consultation tomorrow and advise n.p.o. after midnight.  Patient admitted as above with known, profound history of ambulatory dysfunction and vestibular issues secondary to history TBI and SAH. Concurrent alcohol use makes patient even higher risk for falls - presented for R femur fracture s/p IM nail on 5/7 - ambulating with PT - stable for HHPT/Outpatient therapy. We discussed possibility of CIR evaluation in the future or outpatient follow up for vestibular PT if able to set up this up through PCP. Patient otherwise stable and agreeable for discharge.  Discharge Diagnoses:  Principal Problem:    Femur fracture, right (HCC) Active Problems:   ETOH abuse   Tobacco use   Recurrent falls while walking    Discharge Instructions  Discharge Instructions    Call MD for:  redness, tenderness, or signs of infection (pain, swelling, redness, odor or green/yellow discharge around incision site)   Complete by: As directed    Call MD for:  severe uncontrolled pain   Complete by: As directed    Call MD for:  temperature >100.4   Complete by: As directed    Diet - low sodium heart healthy   Complete by: As directed    Increase activity slowly   Complete by: As directed    Leave dressing on - Keep it clean, dry, and intact until clinic visit   Complete by: As directed    Weight bearing as tolerated   Complete by: As directed    Laterality: right   Extremity: Lower     Allergies as of 12/24/2020      Reactions   Penicillins Swelling      Medication List    STOP taking these medications   naproxen sodium 220 MG tablet Commonly known as: ALEVE     TAKE these medications   docusate sodium 100 MG capsule Commonly known as: Colace Take 1 capsule (100 mg total) by mouth 2 (two) times daily. While taking narcotic pain medicine.   multivitamin with minerals Tabs tablet Take 1 tablet by mouth daily. Start taking on: Dec 25, 2020   oxyCODONE 5 MG immediate release tablet Commonly known as: Roxicodone Take 1 tablet (5 mg total) by mouth every 4 (four) hours as needed  for up to 3 days for moderate pain or severe pain.   rivaroxaban 10 MG Tabs tablet Commonly known as: Xarelto Take 1 tablet (10 mg total) by mouth daily.   senna 8.6 MG Tabs tablet Commonly known as: SENOKOT Take 2 tablets (17.2 mg total) by mouth 2 (two) times daily.   thiamine 100 MG tablet Take 1 tablet (100 mg total) by mouth daily. Start taking on: Dec 25, 2020            Durable Medical Equipment  (From admission, onward)         Start     Ordered   12/24/20 1445  DME Walker  Once        Question Answer Comment  Walker: With 5 Inch Wheels   Patient needs a walker to treat with the following condition Ambulatory dysfunction      12/24/20 1445           Discharge Care Instructions  (From admission, onward)         Start     Ordered   12/24/20 0000  Leave dressing on - Keep it clean, dry, and intact until clinic visit        12/24/20 1445   12/23/20 0000  Weight bearing as tolerated       Question Answer Comment  Laterality right   Extremity Lower      12/23/20 0748          Follow-up Information    Toni Arthurs, MD. Schedule an appointment as soon as possible for a visit in 2 week(s).   Specialty: Orthopedic Surgery Contact information: [address] [place] [address] [phone]              Allergies  Allergen Reactions  . Penicillins Swelling    Consultations:  Ortho   Procedures/Studies: DG Shoulder Right  Result Date: 12/21/2020 CLINICAL DATA:  Status post fall. EXAM: RIGHT SHOULDER - 2+ VIEW COMPARISON:  None. FINDINGS: There is no evidence of acute fracture or dislocation. Multiple chronic right-sided rib fractures are noted. There is no evidence of arthropathy. Mild chronic bowing of the right humeral shaft is seen. Soft tissues are unremarkable. IMPRESSION: No acute osseous abnormality. Electronically Signed   By: Aram Candela M.D.   On: 12/21/2020 21:06   DG Knee Complete 4 Views Right  Result Date: 12/21/2020 CLINICAL DATA:  Recent fall with knee pain, initial encounter EXAM: RIGHT KNEE - COMPLETE 4+ VIEW COMPARISON:  None. FINDINGS: No evidence of fracture, dislocation, or joint effusion. No evidence of arthropathy or other focal bone abnormality. Soft tissues are unremarkable. IMPRESSION: No acute abnormality noted. Electronically Signed   By: Alcide Clever M.D.   On: 12/21/2020 21:03   DG C-Arm 1-60 Min-No Report  Result Date: 12/22/2020 Fluoroscopy was utilized by the requesting physician.  No  radiographic interpretation.   DG HIP OPERATIVE UNILAT W OR W/O PELVIS RIGHT  Result Date: 12/22/2020 CLINICAL DATA:  Operative fixation of a right hip fracture. EXAM: OPERATIVE RIGHT HIP (WITH PELVIS IF PERFORMED) 4 VIEWS TECHNIQUE: Fluoroscopic spot image(s) were submitted for interpretation post-operatively. COMPARISON:  12/21/2020 FINDINGS: Compression screw and rod of the recently demonstrated right intertrochanteric fracture with anatomic position and alignment. IMPRESSION: Operative fixation of the recently demonstrated right intertrochanteric fracture. Electronically Signed   By: Beckie Salts M.D.   On: 12/22/2020 14:15   DG Hip Unilat W or Wo Pelvis 2-3 Views Right  Result Date: 12/21/2020 CLINICAL DATA:  Status post fall. EXAM: DG HIP (WITH OR WITHOUT PELVIS) 2-3V RIGHT COMPARISON:  None. FINDINGS: An acute, comminuted fracture deformity is seen extending through the inter trochanteric region of the proximal right femur. There is no evidence of dislocation. Soft tissue swelling is seen along the lateral aspect of the right hip. IMPRESSION: Acute fracture of the proximal right femur. Electronically Signed   By: Aram Candela M.D.   On: 12/21/2020 21:07      Subjective: No acute issues/events overnight, requesting discharge home.   Discharge Exam: Vitals:   12/24/20 0559 12/24/20 1313  BP: 121/66 (!) 152/90  Pulse: 71 78  Resp: 18 18  Temp: 98.5 F (36.9 C) (!) 97.5 F (36.4 C)  SpO2: 96% 99%   Vitals:   12/23/20 1413 12/23/20 2015 12/24/20 0559 12/24/20 1313  BP:  127/86 121/66 (!) 152/90  Pulse: 94 79 71 78  Resp:  17 18 18   Temp:  (!) 97.3 F (36.3 C) 98.5 F (36.9 C) (!) 97.5 F (36.4 C)  TempSrc:  Oral Oral Oral  SpO2: 99% 100% 96% 99%  Weight:      Height:        General: Pt is alert, awake, not in acute distress Cardiovascular: RRR, S1/S2 +, no rubs, no gallops Respiratory: CTA bilaterally, no wheezing, no rhonchi Abdominal: Soft, NT, ND, bowel sounds  + Extremities: no edema, no cyanosis; R hip bandage clean/dry/intact   The results of significant diagnostics from this hospitalization (including imaging, microbiology, ancillary and laboratory) are listed below for reference.     Microbiology: Recent Results (from the past 240 hour(s))  SARS CORONAVIRUS 2 (TAT 6-24 HRS) Nasopharyngeal Nasopharyngeal Swab     Status: None   Collection Time: 12/21/20  9:50 PM   Specimen: Nasopharyngeal Swab  Result Value Ref Range Status   SARS Coronavirus 2 NEGATIVE NEGATIVE Final    Comment: (NOTE) SARS-CoV-2 target nucleic acids are NOT DETECTED.  The SARS-CoV-2 RNA is generally detectable in upper and lower respiratory specimens during the acute phase of infection. Negative results do not preclude SARS-CoV-2 infection, do not rule out co-infections with other pathogens, and should not be used as the sole basis for treatment or other patient management decisions. Negative results must be combined with clinical observations, patient history, and epidemiological information. The expected result is Negative.  Fact Sheet for Patients: 02/20/21  Fact Sheet for Healthcare Providers: HairSlick.no  This test is not yet approved or cleared by the [place]ir[place]ir.com FDA and  has been authorized for detection and/or diagnosis of SARS-CoV-2 by FDA under an Emergency Use Authorization (EUA). This EUA will remain  in effect (meaning this test can be used) for the duration of the COVID-19 declaration under Se ction 564(b)(1) of the Act, 21 U.S.C. section 360bbb-3(b)(1), unless the authorization is terminated or revoked sooner.  Performed at [facility], [address] [place], [place]   Resp Panel by RT-PCR (Flu A&B, Covid) Nasal Mucosa     Status: None   Collection Time: 12/22/20  8:09 AM   Specimen: Nasal Mucosa; Nasopharyngeal(NP) swabs in vial transport medium  Result  Value Ref Range Status   SARS Coronavirus 2 by RT PCR NEGATIVE NEGATIVE Final    Comment: (NOTE) SARS-CoV-2 target nucleic acids are NOT DETECTED.  The SARS-CoV-2 RNA is generally detectable in upper respiratory specimens during the acute phase of infection. The lowest concentration of SARS-CoV-2 viral copies this assay can detect is 138 copies/mL. A negative result does  not preclude SARS-Cov-2 infection and should not be used as the sole basis for treatment or other patient management decisions. A negative result may occur with  improper specimen collection/handling, submission of specimen other than nasopharyngeal swab, presence of viral mutation(s) within the areas targeted by this assay, and inadequate number of viral copies(<138 copies/mL). A negative result must be combined with clinical observations, patient history, and epidemiological information. The expected result is Negative.  Fact Sheet for Patients:  BloggerCourse.com  Fact Sheet for Healthcare Providers:  SeriousBroker.it  This test is no t yet approved or cleared by the [place] FDA and  has been authorized for detection and/or diagnosis of SARS-CoV-2 by FDA under an Emergency Use Authorization (EUA). This EUA will remain  in effect (meaning this test can be used) for the duration of the COVID-19 declaration under Section 564(b)(1) of the Act, 21 U.S.C.section 360bbb-3(b)(1), unless the authorization is terminated  or revoked sooner.       Influenza A by PCR NEGATIVE NEGATIVE Final   Influenza B by PCR NEGATIVE NEGATIVE Final    Comment: (NOTE) The Xpert Xpress SARS-CoV-2/FLU/RSV plus assay is intended as an aid in the diagnosis of influenza from Nasopharyngeal swab specimens and should not be used as a sole basis for treatment. Nasal washings and aspirates are unacceptable for Xpert Xpress SARS-CoV-2/FLU/RSV testing.  Fact Sheet for  Patients: BloggerCourse.com  Fact Sheet for Healthcare Providers: SeriousBroker.it  This test is not yet approved or cleared by the [place] FDA and has been authorized for detection and/or diagnosis of SARS-CoV-2 by FDA under an Emergency Use Authorization (EUA). This EUA will remain in effect (meaning this test can be used) for the duration of the COVID-19 declaration under Section 564(b)(1) of the Act, 21 U.S.C. section 360bbb-3(b)(1), unless the authorization is terminated or revoked.  Performed at [facility], [address] [place], [place] [address]   Surgical pcr screen     Status: None   Collection Time: 12/22/20  8:23 AM   Specimen: Nasal Mucosa; Nasal Swab  Result Value Ref Range Status   MRSA, PCR NEGATIVE NEGATIVE Final   Staphylococcus aureus NEGATIVE NEGATIVE Final    Comment: (NOTE) The Xpert SA Assay (FDA approved for NASAL specimens in patients 51 years of age and older), is one component of a comprehensive surveillance program. It is not intended to diagnose infection nor to guide or monitor treatment. Performed at [facility], [address], [place], [place] [address]      Labs: BNP (last 3 results) No results for input(s): BNP in the last 8760 hours. Basic Metabolic Panel: Recent Labs  Lab 12/21/20 2149 12/22/20 0524 12/24/20 0404  NA 134* 132* 134*  K 3.5 3.8 3.9  CL 99 100 100  CO2 21* 23 30  GLUCOSE 112* 93 112*  BUN 7* 8 6*  CREATININE 0.54* 0.53* 0.52*  CALCIUM 8.9 8.5* 8.9   Liver Function Tests: No results for input(s): AST, ALT, ALKPHOS, BILITOT, PROT, ALBUMIN in the last 168 hours. No results for input(s): LIPASE, AMYLASE in the last 168 hours. No results for input(s): AMMONIA in the last 168 hours. CBC: Recent Labs  Lab 12/21/20 2149 12/22/20 0524 12/24/20 0404  WBC 13.9* 10.3 10.0  NEUTROABS 10.8*  --   --   HGB 15.1 13.8 12.3*   HCT 43.5 40.1 35.7*  MCV 99.3 98.0 100.0  PLT 175 159 168   Cardiac Enzymes: No results for input(s): CKTOTAL, CKMB, CKMBINDEX, TROPONINI  in the last 168 hours. BNP: Invalid input(s): POCBNP CBG: Recent Labs  Lab 12/21/20 2221  GLUCAP 110*   D-Dimer No results for input(s): DDIMER in the last 72 hours. Hgb A1c No results for input(s): HGBA1C in the last 72 hours. Lipid Profile No results for input(s): CHOL, HDL, LDLCALC, TRIG, CHOLHDL, LDLDIRECT in the last 72 hours. Thyroid function studies No results for input(s): TSH, T4TOTAL, T3FREE, THYROIDAB in the last 72 hours.  Invalid input(s): FREET3 Anemia work up No results for input(s): VITAMINB12, FOLATE, FERRITIN, TIBC, IRON, RETICCTPCT in the last 72 hours. Urinalysis    Component Value Date/Time   COLORURINE STRAW (A) 12/21/2020 2123   APPEARANCEUR CLEAR 12/21/2020 2123   LABSPEC 1.008 12/21/2020 2123   PHURINE 6.0 12/21/2020 2123   GLUCOSEU NEGATIVE 12/21/2020 2123   HGBUR SMALL (A) 12/21/2020 2123   BILIRUBINUR NEGATIVE 12/21/2020 2123   KETONESUR 5 (A) 12/21/2020 2123   PROTEINUR NEGATIVE 12/21/2020 2123   UROBILINOGEN 1.0 03/30/2012 1608   NITRITE NEGATIVE 12/21/2020 2123   LEUKOCYTESUR NEGATIVE 12/21/2020 2123   Sepsis Labs Invalid input(s): PROCALCITONIN,  WBC,  LACTICIDVEN Microbiology Recent Results (from the past 240 hour(s))  SARS CORONAVIRUS 2 (TAT 6-24 HRS) Nasopharyngeal Nasopharyngeal Swab     Status: None   Collection Time: 12/21/20  9:50 PM   Specimen: Nasopharyngeal Swab  Result Value Ref Range Status   SARS Coronavirus 2 NEGATIVE NEGATIVE Final    Comment: (NOTE) SARS-CoV-2 target nucleic acids are NOT DETECTED.  The SARS-CoV-2 RNA is generally detectable in upper and lower respiratory specimens during the acute phase of infection. Negative results do not preclude SARS-CoV-2 infection, do not rule out co-infections with other pathogens, and should not be used as the sole basis for  treatment or other patient management decisions. Negative results must be combined with clinical observations, patient history, and epidemiological information. The expected result is Negative.  Fact Sheet for Patients: HairSlick.nohttps://www.fda.gov/media/138098/download  Fact Sheet for Healthcare Providers: quierodirigir.comhttps://www.fda.gov/media/138095/download  This test is not yet approved or cleared by the [place] FDA and  has been authorized for detection and/or diagnosis of SARS-CoV-2 by FDA under an Emergency Use Authorization (EUA). This EUA will remain  in effect (meaning this test can be used) for the duration of the COVID-19 declaration under Se ction 564(b)(1) of the Act, 21 U.S.C. section 360bbb-3(b)(1), unless the authorization is terminated or revoked sooner.  Performed at [facility], [address] [place], [place] [address]   Resp Panel by RT-PCR (Flu A&B, Covid) Nasal Mucosa     Status: None   Collection Time: 12/22/20  8:09 AM   Specimen: Nasal Mucosa; Nasopharyngeal(NP) swabs in vial transport medium  Result Value Ref Range Status   SARS Coronavirus 2 by RT PCR NEGATIVE NEGATIVE Final    Comment: (NOTE) SARS-CoV-2 target nucleic acids are NOT DETECTED.  The SARS-CoV-2 RNA is generally detectable in upper respiratory specimens during the acute phase of infection. The lowest concentration of SARS-CoV-2 viral copies this assay can detect is 138 copies/mL. A negative result does not preclude SARS-Cov-2 infection and should not be used as the sole basis for treatment or other patient management decisions. A negative result may occur with  improper specimen collection/handling, submission of specimen other than nasopharyngeal swab, presence of viral mutation(s) within the areas targeted by this assay, and inadequate number of viral copies(<138 copies/mL). A negative result must be combined with clinical observations, patient history, and epidemiological information. The  expected result is Negative.  Fact  Sheet for Patients:  BloggerCourse.com  Fact Sheet for Healthcare Providers:  SeriousBroker.it  This test is no t yet approved or cleared by the [place] FDA and  has been authorized for detection and/or diagnosis of SARS-CoV-2 by FDA under an Emergency Use Authorization (EUA). This EUA will remain  in effect (meaning this test can be used) for the duration of the COVID-19 declaration under Section 564(b)(1) of the Act, 21 U.S.C.section 360bbb-3(b)(1), unless the authorization is terminated  or revoked sooner.       Influenza A by PCR NEGATIVE NEGATIVE Final   Influenza B by PCR NEGATIVE NEGATIVE Final    Comment: (NOTE) The Xpert Xpress SARS-CoV-2/FLU/RSV plus assay is intended as an aid in the diagnosis of influenza from Nasopharyngeal swab specimens and should not be used as a sole basis for treatment. Nasal washings and aspirates are unacceptable for Xpert Xpress SARS-CoV-2/FLU/RSV testing.  Fact Sheet for Patients: BloggerCourse.com  Fact Sheet for Healthcare Providers: SeriousBroker.it  This test is not yet approved or cleared by the [place] FDA and has been authorized for detection and/or diagnosis of SARS-CoV-2 by FDA under an Emergency Use Authorization (EUA). This EUA will remain in effect (meaning this test can be used) for the duration of the COVID-19 declaration under Section 564(b)(1) of the Act, 21 U.S.C. section 360bbb-3(b)(1), unless the authorization is terminated or revoked.  Performed at [facility], [address] [place], [place] [address]   Surgical pcr screen     Status: None   Collection Time: 12/22/20  8:23 AM   Specimen: Nasal Mucosa; Nasal Swab  Result Value Ref Range Status   MRSA, PCR NEGATIVE NEGATIVE Final   Staphylococcus aureus NEGATIVE NEGATIVE Final    Comment:  (NOTE) The Xpert SA Assay (FDA approved for NASAL specimens in patients 87 years of age and older), is one component of a comprehensive surveillance program. It is not intended to diagnose infection nor to guide or monitor treatment. Performed at [facility], [address], [place], [place] [address]     Time coordinating discharge: Over 30 minutes  SIGNED:  Azucena Fallen, DO [facility] 12/24/2020, 2:45 PM Pager   If 7PM-7AM, please contact night-coverage [email or web address]

## 2020-12-24 NOTE — Progress Notes (Signed)
Inpatient Rehab Admissions Coordinator:   Consult received and chart reviewed. [facility] unlikely to approve this patient for CIR at this time.  Discussed with Dr. Natale Milch and will sign off for CIR at this time.   Estill Dooms, PT, DPT Admissions Coordinator [phone] 12/24/20  1:08 PM

## 2020-12-25 ENCOUNTER — Encounter (HOSPITAL_COMMUNITY): Payer: Self-pay | Admitting: Orthopedic Surgery

## 2021-01-04 ENCOUNTER — Emergency Department (HOSPITAL_COMMUNITY): Payer: Medicare (Managed Care)

## 2021-01-04 ENCOUNTER — Emergency Department (HOSPITAL_COMMUNITY)
Admission: EM | Admit: 2021-01-04 | Discharge: 2021-01-05 | Disposition: A | Discharge status: Home / Self Care | Payer: Medicare (Managed Care) | Attending: Emergency Medicine | Admitting: Emergency Medicine

## 2021-01-04 ENCOUNTER — Other Ambulatory Visit: Payer: Self-pay

## 2021-01-04 ENCOUNTER — Other Ambulatory Visit (HOSPITAL_COMMUNITY): Payer: Self-pay

## 2021-01-04 DIAGNOSIS — Y908 Blood alcohol level of 240 mg/100 ml or more: Secondary | ICD-10-CM | POA: Insufficient documentation

## 2021-01-04 DIAGNOSIS — F10929 Alcohol use, unspecified with intoxication, unspecified: Secondary | ICD-10-CM

## 2021-01-04 DIAGNOSIS — Z9189 Other specified personal risk factors, not elsewhere classified: Secondary | ICD-10-CM

## 2021-01-04 DIAGNOSIS — W19XXXA Unspecified fall, initial encounter: Secondary | ICD-10-CM

## 2021-01-04 DIAGNOSIS — Z86718 Personal history of other venous thrombosis and embolism: Secondary | ICD-10-CM | POA: Insufficient documentation

## 2021-01-04 DIAGNOSIS — M25551 Pain in right hip: Secondary | ICD-10-CM | POA: Diagnosis not present

## 2021-01-04 DIAGNOSIS — F10129 Alcohol abuse with intoxication, unspecified: Secondary | ICD-10-CM | POA: Diagnosis not present

## 2021-01-04 DIAGNOSIS — R42 Dizziness and giddiness: Secondary | ICD-10-CM | POA: Diagnosis not present

## 2021-01-04 DIAGNOSIS — S8011XA Contusion of right lower leg, initial encounter: Secondary | ICD-10-CM | POA: Insufficient documentation

## 2021-01-04 DIAGNOSIS — S8991XA Unspecified injury of right lower leg, initial encounter: Secondary | ICD-10-CM | POA: Diagnosis present

## 2021-01-04 DIAGNOSIS — Z7901 Long term (current) use of anticoagulants: Secondary | ICD-10-CM | POA: Insufficient documentation

## 2021-01-04 DIAGNOSIS — Z20822 Contact with and (suspected) exposure to covid-19: Secondary | ICD-10-CM | POA: Insufficient documentation

## 2021-01-04 LAB — I-STAT CHEM 8, ED
BUN: 7 mg/dL — ABNORMAL LOW (ref 8–23)
Calcium, Ion: 1.01 mmol/L — ABNORMAL LOW (ref 1.15–1.40)
Chloride: 90 mmol/L — ABNORMAL LOW (ref 98–111)
Creatinine, Ser: 1 mg/dL (ref 0.61–1.24)
Glucose, Bld: 116 mg/dL — ABNORMAL HIGH (ref 70–99)
HCT: 36 % — ABNORMAL LOW (ref 39.0–52.0)
Hemoglobin: 12.2 g/dL — ABNORMAL LOW (ref 13.0–17.0)
Potassium: 3.5 mmol/L (ref 3.5–5.1)
Sodium: 126 mmol/L — ABNORMAL LOW (ref 135–145)
TCO2: 24 mmol/L (ref 22–32)

## 2021-01-04 LAB — CBC
HCT: 35.6 % — ABNORMAL LOW (ref 39.0–52.0)
Hemoglobin: 12.5 g/dL — ABNORMAL LOW (ref 13.0–17.0)
MCH: 34.7 pg — ABNORMAL HIGH (ref 26.0–34.0)
MCHC: 35.1 g/dL (ref 30.0–36.0)
MCV: 98.9 fL (ref 80.0–100.0)
Platelets: 407 10*3/uL — ABNORMAL HIGH (ref 150–400)
RBC: 3.6 MIL/uL — ABNORMAL LOW (ref 4.22–5.81)
RDW: 12.5 % (ref 11.5–15.5)
WBC: 15.8 10*3/uL — ABNORMAL HIGH (ref 4.0–10.5)
nRBC: 0 % (ref 0.0–0.2)

## 2021-01-04 LAB — COMPREHENSIVE METABOLIC PANEL
ALT: 20 U/L (ref 0–44)
AST: 23 U/L (ref 15–41)
Albumin: 3.2 g/dL — ABNORMAL LOW (ref 3.5–5.0)
Alkaline Phosphatase: 125 U/L (ref 38–126)
Anion gap: 10 (ref 5–15)
BUN: 7 mg/dL — ABNORMAL LOW (ref 8–23)
CO2: 24 mmol/L (ref 22–32)
Calcium: 8.3 mg/dL — ABNORMAL LOW (ref 8.9–10.3)
Chloride: 91 mmol/L — ABNORMAL LOW (ref 98–111)
Creatinine, Ser: 0.69 mg/dL (ref 0.61–1.24)
GFR, Estimated: >60 mL/min (ref >60)
Glucose, Bld: 119 mg/dL — ABNORMAL HIGH (ref 70–99)
Potassium: 3.4 mmol/L — ABNORMAL LOW (ref 3.5–5.1)
Sodium: 125 mmol/L — ABNORMAL LOW (ref 135–145)
Total Bilirubin: 0.4 mg/dL (ref 0.3–1.2)
Total Protein: 6.1 g/dL — ABNORMAL LOW (ref 6.5–8.1)

## 2021-01-04 LAB — ETHANOL: Alcohol, Ethyl (B): 263 mg/dL — ABNORMAL HIGH (ref <10)

## 2021-01-04 LAB — PROTIME-INR
INR: 1.4 — ABNORMAL HIGH (ref 0.8–1.2)
Prothrombin Time: 17.5 seconds — ABNORMAL HIGH (ref 11.4–15.2)

## 2021-01-04 LAB — LACTIC ACID, PLASMA: Lactic Acid, Venous: 2.3 mmol/L (ref 0.5–1.9)

## 2021-01-04 LAB — SAMPLE TO BLOOD BANK

## 2021-01-04 MED ORDER — SODIUM CHLORIDE 0.9 % IV BOLUS: 1000.0000 mL | Freq: Once | INTRAVENOUS | Status: DC

## 2021-01-04 NOTE — Progress Notes (Signed)
Orthopedic Tech Progress Note Patient Details:  Raymond Quinn August 02, 1951 [id number]  Patient ID: Raymond Quinn, male   DOB: 05-03-1951, 70 y.o.   MRN: [id number]   Raymond Quinn 01/04/2021, 10:49 PM trauma

## 2021-01-05 LAB — RESP PANEL BY RT-PCR (FLU A&B, COVID) ARPGX2
Influenza A by PCR: NEGATIVE
Influenza B by PCR: NEGATIVE
SARS Coronavirus 2 by RT PCR: NEGATIVE

## 2021-01-05 NOTE — Discharge Summary (Signed)
Care assumed from Dr. Dalene Seltzer at shift change.  Patient awaiting results of multiple imaging studies and laboratory studies after a fall at home.  Patient apparently lost his balance and fell down several stairs.  He broke the handrail of the staircase.  CT scans have returned and are unremarkable.  Laboratory studies are also essentially unremarkable with the exception of his blood alcohol which is 263.  At this point, patient appears well.  He is requesting to go home and I feel as though this is reasonable.  He will be discharged with return as needed.

## 2021-01-05 NOTE — Discharge Instructions (Addendum)
Continue medications as previously prescribed.  Return to the emergency department if you develop severe headache, difficulty breathing, chest pain, or other new and concerning symptoms.

## 2021-01-05 NOTE — ED Provider Notes (Signed)
[facility]   CSN: [id number] Arrival date & time: 01/04/21  2304     History Chief Complaint  Patient presents with  . Fall    Pt BIBA via medic due to fall. Patient noted to have ETOH on board. LOC noted as per medic, patient son found patient unresponsive. Pt has hx of falls in the past. Currently taking Xarelto as prescribed.     Raymond Quinn is a 70 y.o. male.  HPI     70 y.o.malewith medical history significant forTBI with history of subarachnoid hemorrhage, EtOH abuse and recurrent falls with recent admission for right hip fracture/surgery 2 weeks ago placed on xarelto for DVT prophylaxis who presents with concern for fall from home.  Arrives as a Level 2 trauma for fall on blood thinners.  Patient reports history of frequent falls and being off balance. Thinks he gets vertigo, dizziness and is unsteady on his feet.  Does not remember falling today and denies a fall or any concerns.  Family member heard a thunk and found him unconscious in the garage with the banister broken off, as if he had lost balance, grabbed onto it and fell. Suspect he fell down 4-5 stairs. Reports he was unconscious for several minutes.  Pt does not remember this.  "I did not fall today" Does admit to having some wine.  Reports some hip pain since the surgery, denies new headache, neck pain, back pain, chest pain, abdominal pain, shortness of breath, nausea, vomiting.  Denies lightheadedness       No past medical history on file.  There are no problems to display for this patient.      No family history on file.     Home Medications Prior to Admission medications   Not on File    Allergies    Patient has no allergy information on record.  Review of Systems   Review of Systems  Constitutional: Negative for fever.  HENT: Negative for sore throat.   Eyes: Negative for visual disturbance.  Respiratory: Negative for shortness  of breath.   Cardiovascular: Negative for chest pain.  Gastrointestinal: Negative for abdominal pain, nausea and vomiting.  Genitourinary: Negative for difficulty urinating.  Musculoskeletal: Positive for arthralgias. Negative for back pain and neck stiffness.  Skin: Negative for rash.  Neurological: Negative for syncope, facial asymmetry, weakness, light-headedness, numbness and headaches.    Physical Exam Updated Vital Signs BP 105/74 (BP Location: Right Arm)   Pulse 89   Resp 16   Ht 5\' 8"  (1.727 m)   Wt 64.1 kg   SpO2 100%   BMI 21.49 kg/m   Physical Exam Vitals and nursing note reviewed.  Constitutional:      General: He is not in acute distress.    Appearance: He is well-developed. He is not diaphoretic.     Comments: Appears intoxicated  HENT:     Head: Normocephalic and atraumatic.  Eyes:     Conjunctiva/sclera: Conjunctivae normal.  Cardiovascular:     Rate and Rhythm: Normal rate and regular rhythm.     Heart sounds: Normal heart sounds. No murmur heard. No friction rub. No gallop.   Pulmonary:     Effort: Pulmonary effort is normal. No respiratory distress.     Breath sounds: Normal breath sounds. No wheezing or rales.  Chest:     Chest wall: No tenderness.  Abdominal:     General: There is no distension.     Palpations:  Abdomen is soft.     Tenderness: There is no abdominal tenderness. There is no guarding.  Musculoskeletal:        General: Tenderness present.     Cervical back: Normal range of motion.     Comments: Right hip tenderness, contusion present to right leg No neck, back tenderness   Skin:    General: Skin is warm and dry.  Neurological:     General: No focal deficit present.     Mental Status: He is alert and oriented to person, place, and time.     Comments: RLE strength limited by recent surgery/pain      ED Results / Procedures / Treatments   Labs (all labs ordered are listed, but only abnormal results are displayed) Labs  Reviewed  COMPREHENSIVE METABOLIC PANEL - Abnormal; Notable for the following components:      Result Value   Sodium 125 (*)    Potassium 3.4 (*)    Chloride 91 (*)    Glucose, Bld 119 (*)    BUN 7 (*)    Calcium 8.3 (*)    Total Protein 6.1 (*)    Albumin 3.2 (*)    All other components within normal limits  CBC - Abnormal; Notable for the following components:   WBC 15.8 (*)    RBC 3.60 (*)    Hemoglobin 12.5 (*)    HCT 35.6 (*)    MCH 34.7 (*)    Platelets 407 (*)    All other components within normal limits  ETHANOL - Abnormal; Notable for the following components:   Alcohol, Ethyl (B) 263 (*)    All other components within normal limits  LACTIC ACID, PLASMA - Abnormal; Notable for the following components:   Lactic Acid, Venous 2.3 (*)    All other components within normal limits  PROTIME-INR - Abnormal; Notable for the following components:   Prothrombin Time 17.5 (*)    INR 1.4 (*)    All other components within normal limits  I-STAT CHEM 8, ED - Abnormal; Notable for the following components:   Sodium 126 (*)    Chloride 90 (*)    BUN 7 (*)    Glucose, Bld 116 (*)    Calcium, Ion 1.01 (*)    Hemoglobin 12.2 (*)    HCT 36.0 (*)    All other components within normal limits  RESP PANEL BY RT-PCR (FLU A&B, COVID) [id number]  URINALYSIS, ROUTINE W REFLEX MICROSCOPIC  SAMPLE TO BLOOD BANK    EKG None  Radiology CT ABDOMEN PELVIS WO CONTRAST  Result Date: 01/05/2021 CLINICAL DATA:  Post fall with chest and abdominal trauma. EXAM: CT CHEST, ABDOMEN AND PELVIS WITHOUT CONTRAST TECHNIQUE: Multidetector CT imaging of the chest, abdomen and pelvis was performed following the standard protocol without IV contrast. COMPARISON:  None. FINDINGS: CT CHEST FINDINGS Cardiovascular: No periaortic stranding to suggest aortic injury. Mild aortic atherosclerosis. Coronary artery calcifications. Normal heart size. No pericardial effusion. Mediastinum/Nodes: No mediastinal hemorrhage or  hematoma. Patulous fluid-filled esophagus. No esophageal wall thickening. No mediastinal adenopathy. No suspicious thyroid nodule. No pneumomediastinum. Lungs/Pleura: Emphysema. Scattered linear atelectasis/scarring in both lungs. No pneumothorax or pulmonary contusion. No pleural fluid. There is retained mucus within the right mainstem bronchus extending into the lower lobe bronchi. Scattered lower lobe bronchial thickening. No pulmonary mass or suspicious nodule. Musculoskeletal: Multiple bilateral rib fractures. Remote distal left clavicle fracture. 1No acute fracture of the sternum, included clavicles or shoulder girdles. No fracture of the thoracic  spine. No evidence of acute rib fracture. CT ABDOMEN PELVIS FINDINGS Hepatobiliary: No evidence of a Paddock injury on this noncontrast exam. There is no perihepatic hematoma. Small gallstone within physiologically distended gallbladder. Pancreas: Parenchymal atrophy. No evidence of injury. No ductal dilatation or inflammation. Spleen: No evidence of injury on this noncontrast exam. No perisplenic hematoma. Normal in size. Adrenals/Urinary Tract: No adrenal or renal hemorrhage. No hydronephrosis. Mild right renal atrophy. No renal calculi. No bladder injury. Portions of the right bladder dome approach the right inguinal ring. Stomach/Bowel: Distended stomach with ingested contents. No evidence of stomach, small bowel, or colonic injury. Normal appendix. Mild colonic diverticulosis without diverticulitis. Vascular/Lymphatic: Aortic atherosclerosis and tortuosity. No retroperitoneal fluid or vascular stranding to suggest injury. No abdominopelvic adenopathy. Reproductive: Prominent prostate gland. Other: No confluent body wall contusion.  No free fluid or free air. Musculoskeletal: Recent ORIF intertrochanteric right femur fracture. Lateral skin staples remain in place. Expected postsurgical change in the soft tissue. No acute fracture of the pelvis. No acute fracture  of the lumbar spine. IMPRESSION: 1. No evidence of acute traumatic injury to the chest, abdomen, or pelvis on this noncontrast exam. 2. Remote bilateral rib fractures. Remote distal left clavicle fracture. 3. Incidental findings in the abdomen and pelvis of cholelithiasis and colonic diverticulosis. 4. Recent ORIF of intertrochanteric right femur fracture. 5. Incidental findings in the chest include retained mucus in the right mainstem bronchus. Mild emphysema. 6. Thoracoabdominal aortic atherosclerosis. Coronary artery calcifications. Aortic Atherosclerosis (ICD10-I70.0) and Emphysema (ICD10-J43.9). Electronically Signed   By: Narda Rutherford M.D.   On: 01/05/2021 00:02   CT HEAD WO CONTRAST  Addendum Date: 01/05/2021   ADDENDUM REPORT: 01/05/2021 00:27 ADDENDUM: There is no acute fracture or static subluxation of the cervical spine. There is mild degenerative disc disease within the mid cervical spine without bony spinal canal stenosis. Alignment is normal. Lung apices are clear. Electronically Signed   By: Deatra Robinson M.D.   On: 01/05/2021 00:27   Result Date: 01/05/2021 CLINICAL DATA:  Head trauma EXAM: CT HEAD WITHOUT CONTRAST TECHNIQUE: Contiguous axial images were obtained from the base of the skull through the vertex without intravenous contrast. COMPARISON:  None. FINDINGS: Brain: There is no mass, hemorrhage or extra-axial collection. There is generalized atrophy without lobar predilection. Hypodensity of the white matter is most commonly associated with chronic microvascular disease. Vascular: No abnormal hyperdensity of the major intracranial arteries or dural venous sinuses. No intracranial atherosclerosis. Skull: The visualized skull base, calvarium and extracranial soft tissues are normal. Sinuses/Orbits: No fluid levels or advanced mucosal thickening of the visualized paranasal sinuses. No mastoid or middle ear effusion. The orbits are normal. IMPRESSION: Chronic microvascular ischemia and  generalized atrophy without acute intracranial abnormality. Electronically Signed: By: Deatra Robinson M.D. On: 01/04/2021 23:49   CT Chest Wo Contrast  Result Date: 01/05/2021 CLINICAL DATA:  Post fall with chest and abdominal trauma. EXAM: CT CHEST, ABDOMEN AND PELVIS WITHOUT CONTRAST TECHNIQUE: Multidetector CT imaging of the chest, abdomen and pelvis was performed following the standard protocol without IV contrast. COMPARISON:  None. FINDINGS: CT CHEST FINDINGS Cardiovascular: No periaortic stranding to suggest aortic injury. Mild aortic atherosclerosis. Coronary artery calcifications. Normal heart size. No pericardial effusion. Mediastinum/Nodes: No mediastinal hemorrhage or hematoma. Patulous fluid-filled esophagus. No esophageal wall thickening. No mediastinal adenopathy. No suspicious thyroid nodule. No pneumomediastinum. Lungs/Pleura: Emphysema. Scattered linear atelectasis/scarring in both lungs. No pneumothorax or pulmonary contusion. No pleural fluid. There is retained mucus within the right mainstem bronchus extending into  the lower lobe bronchi. Scattered lower lobe bronchial thickening. No pulmonary mass or suspicious nodule. Musculoskeletal: Multiple bilateral rib fractures. Remote distal left clavicle fracture. 1No acute fracture of the sternum, included clavicles or shoulder girdles. No fracture of the thoracic spine. No evidence of acute rib fracture. CT ABDOMEN PELVIS FINDINGS Hepatobiliary: No evidence of a Paddock injury on this noncontrast exam. There is no perihepatic hematoma. Small gallstone within physiologically distended gallbladder. Pancreas: Parenchymal atrophy. No evidence of injury. No ductal dilatation or inflammation. Spleen: No evidence of injury on this noncontrast exam. No perisplenic hematoma. Normal in size. Adrenals/Urinary Tract: No adrenal or renal hemorrhage. No hydronephrosis. Mild right renal atrophy. No renal calculi. No bladder injury. Portions of the right bladder  dome approach the right inguinal ring. Stomach/Bowel: Distended stomach with ingested contents. No evidence of stomach, small bowel, or colonic injury. Normal appendix. Mild colonic diverticulosis without diverticulitis. Vascular/Lymphatic: Aortic atherosclerosis and tortuosity. No retroperitoneal fluid or vascular stranding to suggest injury. No abdominopelvic adenopathy. Reproductive: Prominent prostate gland. Other: No confluent body wall contusion.  No free fluid or free air. Musculoskeletal: Recent ORIF intertrochanteric right femur fracture. Lateral skin staples remain in place. Expected postsurgical change in the soft tissue. No acute fracture of the pelvis. No acute fracture of the lumbar spine. IMPRESSION: 1. No evidence of acute traumatic injury to the chest, abdomen, or pelvis on this noncontrast exam. 2. Remote bilateral rib fractures. Remote distal left clavicle fracture. 3. Incidental findings in the abdomen and pelvis of cholelithiasis and colonic diverticulosis. 4. Recent ORIF of intertrochanteric right femur fracture. 5. Incidental findings in the chest include retained mucus in the right mainstem bronchus. Mild emphysema. 6. Thoracoabdominal aortic atherosclerosis. Coronary artery calcifications. Aortic Atherosclerosis (ICD10-I70.0) and Emphysema (ICD10-J43.9). Electronically Signed   By: Narda Rutherford M.D.   On: 01/05/2021 00:02   CT CERVICAL SPINE WO CONTRAST  Addendum Date: 01/05/2021   ADDENDUM REPORT: 01/05/2021 00:27 ADDENDUM: There is no acute fracture or static subluxation of the cervical spine. There is mild degenerative disc disease within the mid cervical spine without bony spinal canal stenosis. Alignment is normal. Lung apices are clear. Electronically Signed   By: Deatra Robinson M.D.   On: 01/05/2021 00:27   Result Date: 01/05/2021 CLINICAL DATA:  Head trauma EXAM: CT HEAD WITHOUT CONTRAST TECHNIQUE: Contiguous axial images were obtained from the base of the skull through the  vertex without intravenous contrast. COMPARISON:  None. FINDINGS: Brain: There is no mass, hemorrhage or extra-axial collection. There is generalized atrophy without lobar predilection. Hypodensity of the white matter is most commonly associated with chronic microvascular disease. Vascular: No abnormal hyperdensity of the major intracranial arteries or dural venous sinuses. No intracranial atherosclerosis. Skull: The visualized skull base, calvarium and extracranial soft tissues are normal. Sinuses/Orbits: No fluid levels or advanced mucosal thickening of the visualized paranasal sinuses. No mastoid or middle ear effusion. The orbits are normal. IMPRESSION: Chronic microvascular ischemia and generalized atrophy without acute intracranial abnormality. Electronically Signed: By: Deatra Robinson M.D. On: 01/04/2021 23:49   DG Pelvis Portable  Result Date: 01/04/2021 CLINICAL DATA:  Status post fall. EXAM: PORTABLE PELVIS 1-2 VIEWS COMPARISON:  None. FINDINGS: There is no evidence of an acute pelvic fracture or diastasis. A radiopaque intramedullary rod and compression screw device are seen within the proximal right femur. No pelvic bone lesions are seen. Radiopaque skin staples are seen along the lateral aspect of the right hip. IMPRESSION: ORIF of the proximal right femur. Electronically Signed   By:  Aram Candela M.D.   On: 01/04/2021 23:15   DG Chest Portable 1 View  Result Date: 01/04/2021 CLINICAL DATA:  Status post fall. EXAM: PORTABLE CHEST 1 VIEW COMPARISON:  August 03, 2015 FINDINGS: The lungs are hyperinflated. Radiopaque cardiac lead wires are seen overlying the mid and lower left lung. There is no evidence of acute infiltrate, pleural effusion or pneumothorax. The heart size and mediastinal contours are within normal limits. Chronic left-sided rib fractures are seen. A chronic fracture of the mid left clavicle is also noted. IMPRESSION: No active cardiopulmonary disease. Electronically Signed    By: Aram Candela M.D.   On: 01/04/2021 23:16    Procedures Procedures   Medications Ordered in ED Medications  sodium chloride 0.9 % bolus 1,000 mL (1,000 mLs Intravenous Bolus 01/05/21 0121)    ED Course  I have reviewed the triage vital signs and the nursing notes.  Pertinent labs & imaging results that were available during my care of the patient were reviewed by me and considered in my medical decision making (see chart for details).    MDM Rules/Calculators/A&P                           70 y.o.malewith medical history significant forTBI with history of subarachnoid hemorrhage, EtOH abuse and recurrent falls with recent admission for right hip fracture/surgery 2 weeks ago placed on xarelto for DVT prophylaxis who presents with concern for fall down 4-5 stairs, intoxication, as a Level 2 trauma for fall on blood thinners.    By history of broken banister per report sounds to be fall with syncope/amesia rather than syncope and fall.   Given intoxication, amnesia to event, mechanism of fall from 5 feet potentially, feel he is not a reliable historian and obtained CT head/CSpine/C/A/P for further evaluation as well as labs. Signed out to Dr. Judd Lien with these pending.   Final Clinical Impression(s) / ED Diagnoses Final diagnoses:  Fall, initial encounter  At risk for injury associated with anticoagulation  Alcoholic intoxication with complication [facility])    Rx / DC Orders ED Discharge Orders    None       Alvira Monday, MD 01/05/21 [phone]

## 2022-04-23 DIAGNOSIS — R2689 Other abnormalities of gait and mobility: Secondary | ICD-10-CM | POA: Diagnosis not present

## 2022-04-23 DIAGNOSIS — R270 Ataxia, unspecified: Secondary | ICD-10-CM | POA: Diagnosis not present

## 2022-04-23 DIAGNOSIS — R634 Abnormal weight loss: Secondary | ICD-10-CM | POA: Diagnosis not present

## 2022-10-23 ENCOUNTER — Ambulatory Visit: Payer: Medicare Other | Admitting: Diagnostic Neuroimaging

## 2022-12-14 IMAGING — CT CT CHEST W/O CM
2 of 4 series · 14 of 46 positions shown, 16 images · non-contrast
Comparison: None.

CLINICAL DATA: Post fall with chest and abdominal trauma.

EXAM:
CT CHEST, ABDOMEN AND PELVIS WITHOUT CONTRAST
TECHNIQUE: Multidetector CT imaging of the chest, abdomen and pelvis was
performed following the standard protocol without IV contrast.

[Series 3: cap without · axial · non-contrast · 0.78mm/px · z∈[-636,-61]mm · 11 of 137 slices shown, 13 images]
[im 11/137  soft-tissue]
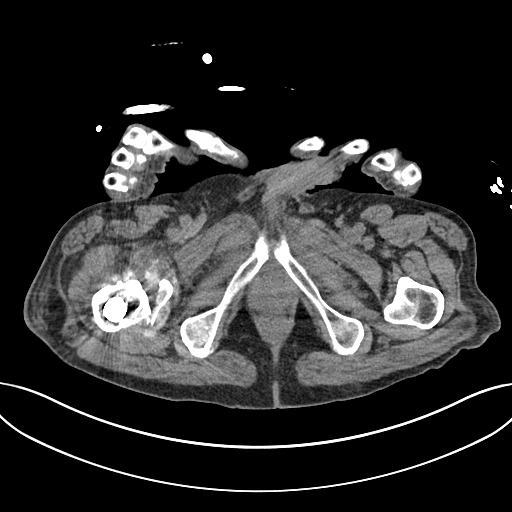
[im 11/137  bone]
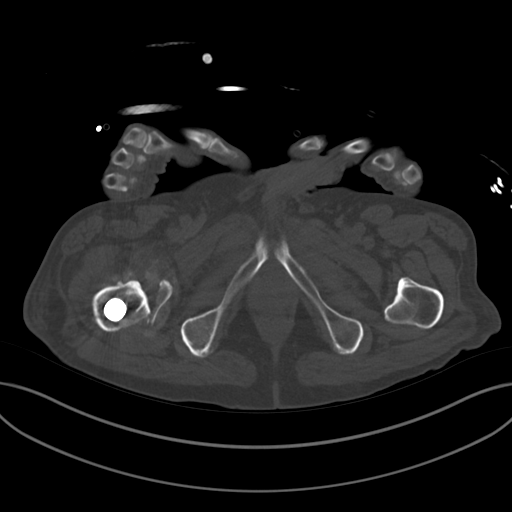
[im 21/137  soft-tissue]
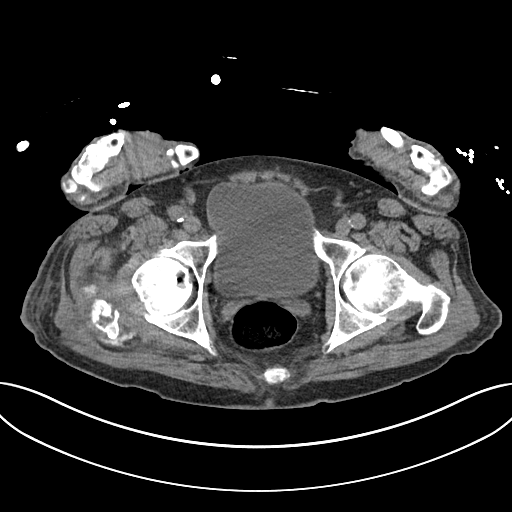
[im 32/137  soft-tissue]
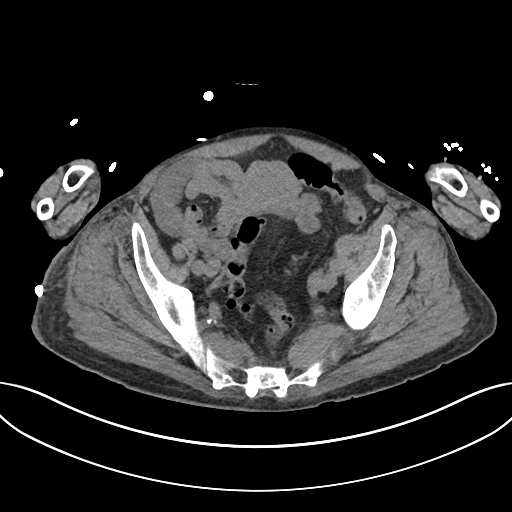
[im 42/137  soft-tissue]
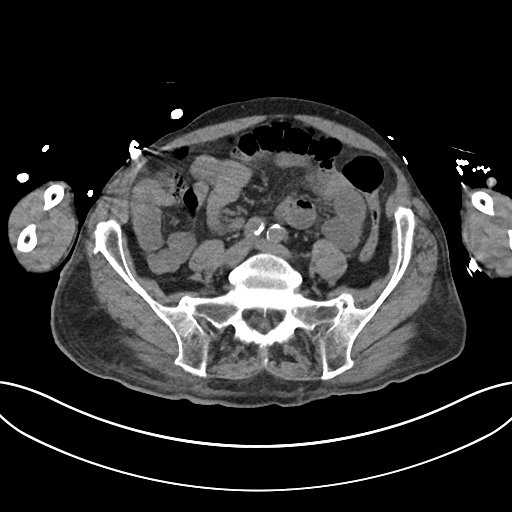
[im 53/137  soft-tissue]
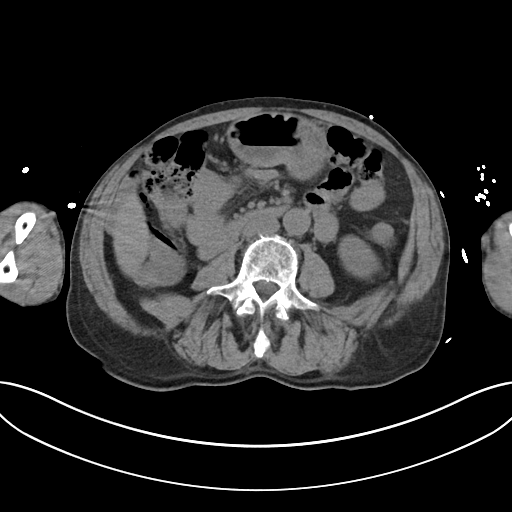
[im 74/137  soft-tissue]
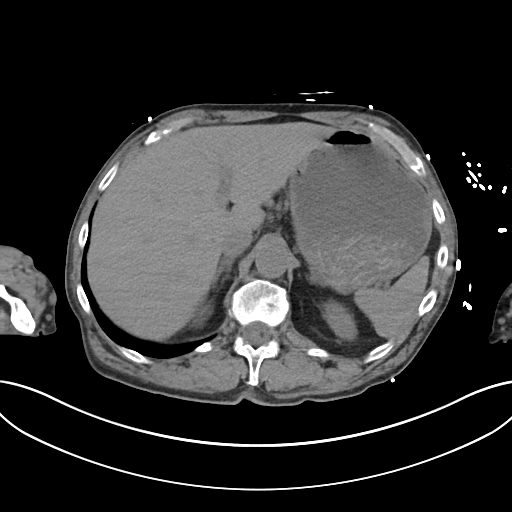
[im 84/137  soft-tissue]
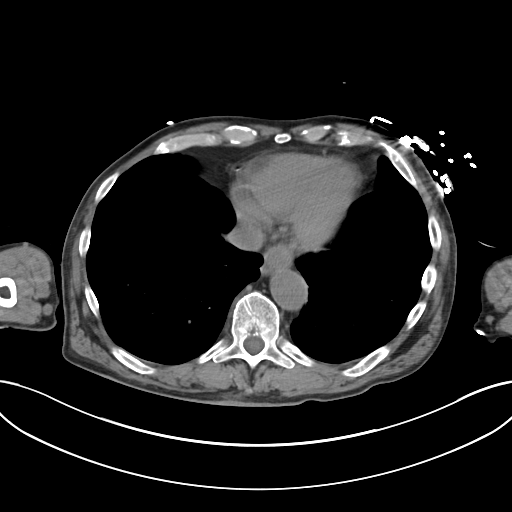
[im 95/137  soft-tissue]
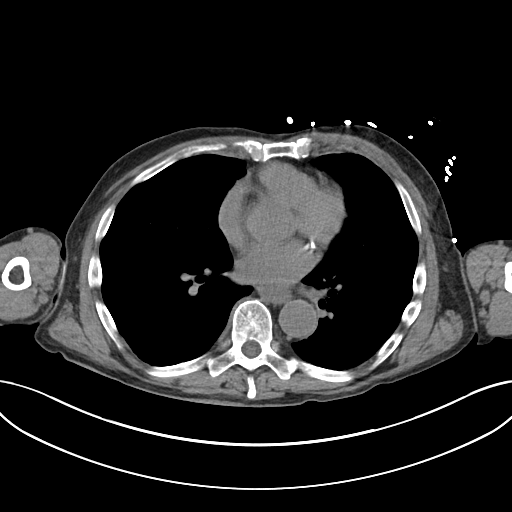
[im 105/137  soft-tissue]
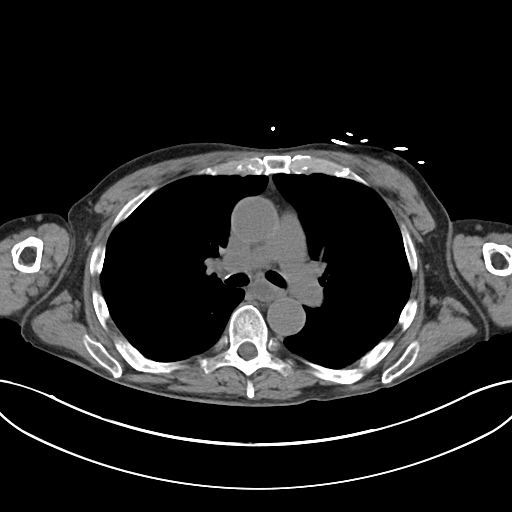
[im 105/137  bone]
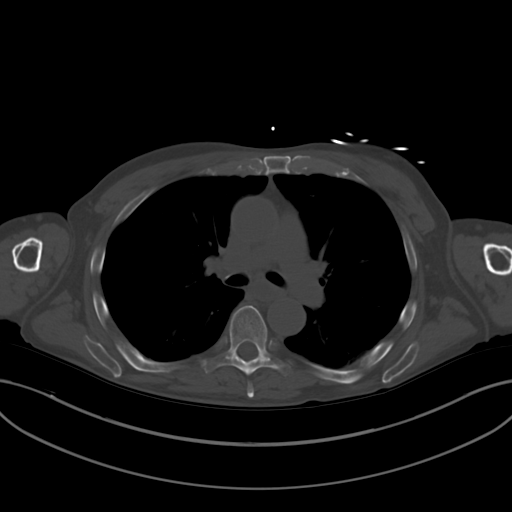
[im 116/137  soft-tissue]
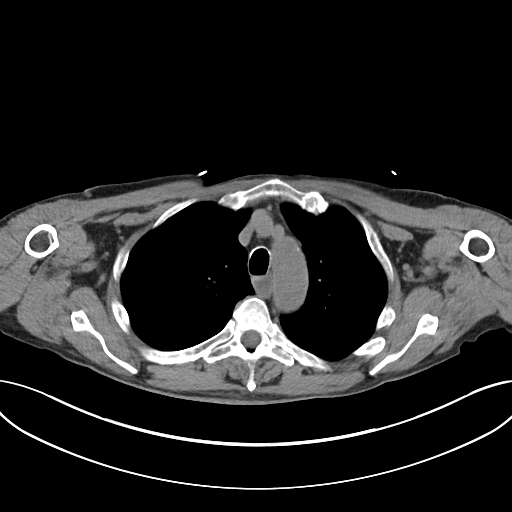
[im 126/137  soft-tissue]
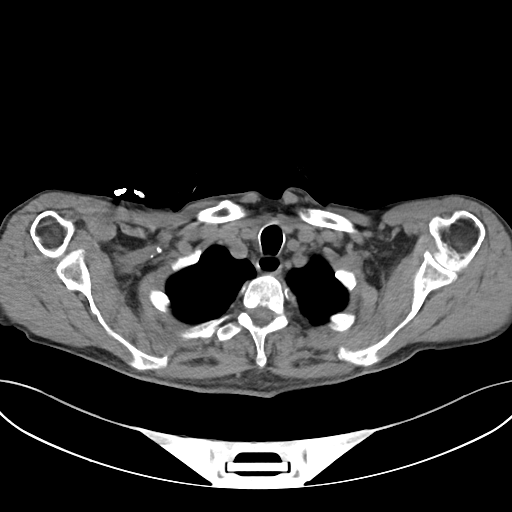

[Series 6: cor · coronal · 0.96mm/px · 3 of 111 slices shown]
[im 37/111  soft-tissue]
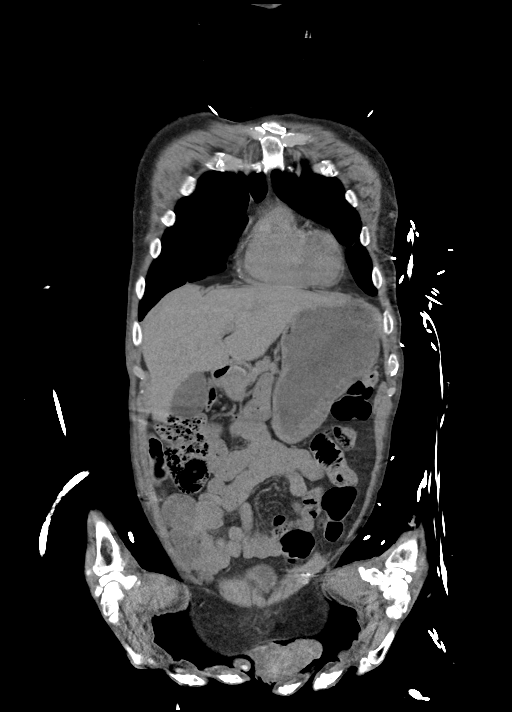
[im 49/111  soft-tissue]
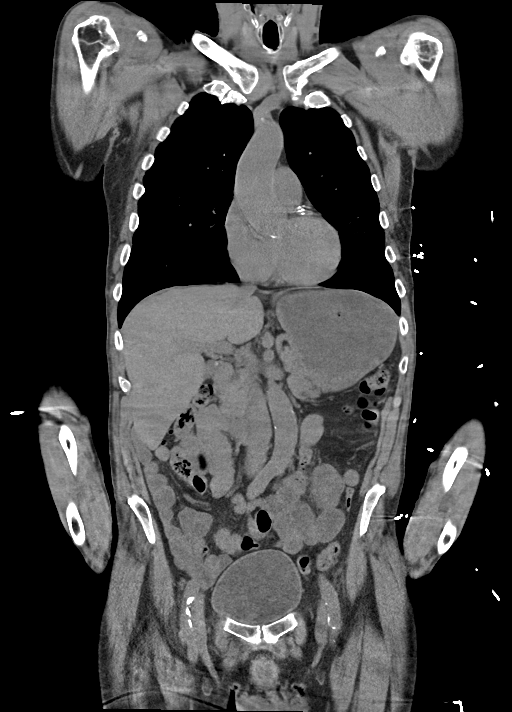
[im 62/111  soft-tissue]
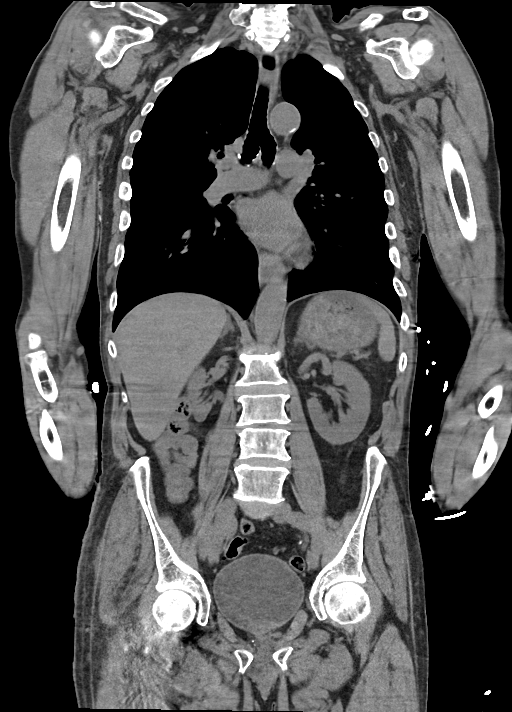

[14 of 46 positions shown; findings below may reference images not displayed]

FINDINGS: CT CHEST FINDINGS

Cardiovascular: No periaortic stranding to suggest aortic injury.
Mild aortic atherosclerosis. Coronary artery calcifications. Normal
heart size. No pericardial effusion.

Mediastinum/Nodes: No mediastinal hemorrhage or hematoma. Patulous
fluid-filled esophagus. No esophageal wall thickening. No
mediastinal adenopathy. No suspicious thyroid nodule. No
pneumomediastinum.

Lungs/Pleura: Emphysema. Scattered linear atelectasis/scarring in
both lungs. No pneumothorax or pulmonary contusion. No pleural
fluid. There is retained mucus within the right mainstem bronchus
extending into the lower lobe bronchi. Scattered lower lobe
bronchial thickening. No pulmonary mass or suspicious nodule.

Musculoskeletal: Multiple bilateral rib fractures. Remote distal
left clavicle fracture. 1No acute fracture of the sternum, included
clavicles or shoulder girdles. No fracture of the thoracic spine. No
evidence of acute rib fracture.

CT ABDOMEN PELVIS FINDINGS

Hepatobiliary: No evidence of Maria Susana Carlitos injury on this noncontrast
exam. There is no perihepatic hematoma. Small gallstone within
physiologically distended gallbladder.

Pancreas: Parenchymal atrophy. No evidence of injury. No ductal
dilatation or inflammation.

Spleen: No evidence of injury on this noncontrast exam. No
perisplenic hematoma. Normal in size.

Adrenals/Urinary Tract: No adrenal or renal hemorrhage. No
hydronephrosis. Mild right renal atrophy. No renal calculi. No
bladder injury. Portions of the right bladder dome approach the
right inguinal ring.

Stomach/Bowel: Distended stomach with ingested contents. No evidence
of stomach, small bowel, or colonic injury. Normal appendix. Mild
colonic diverticulosis without diverticulitis.

Vascular/Lymphatic: Aortic atherosclerosis and tortuosity. No
retroperitoneal fluid or vascular stranding to suggest injury. No
abdominopelvic adenopathy.

Reproductive: Prominent prostate gland.

Other: No confluent body wall contusion.  No free fluid or free air.

Musculoskeletal: Recent ORIF intertrochanteric right femur fracture.
Lateral skin staples remain in place. Expected postsurgical change
in the soft tissue. No acute fracture of the pelvis. No acute
fracture of the lumbar spine.
IMPRESSION: 1. No evidence of acute traumatic injury to the chest, abdomen, or
pelvis on this noncontrast exam.
2. Remote bilateral rib fractures. Remote distal left clavicle
fracture.
3. Incidental findings in the abdomen and pelvis of cholelithiasis
and colonic diverticulosis.
4. Recent ORIF of intertrochanteric right femur fracture.
5. Incidental findings in the chest include retained mucus in the
right mainstem bronchus. Mild emphysema.
6. Thoracoabdominal aortic atherosclerosis. Coronary artery
calcifications.

Aortic Atherosclerosis (HGT3H-3C8.8) and Emphysema (HGT3H-09F.7).

## 2022-12-14 IMAGING — CT CT CERVICAL SPINE W/O CM
3 of 4 series · 12 of 33 positions shown, 14 images · non-contrast
Comparison: None.
COMPARISON: None.

Addendum:
CLINICAL DATA: Head trauma

EXAM:
CT HEAD WITHOUT CONTRAST
TECHNIQUE: Contiguous axial images were obtained from the base of the skull
through the vertex without intravenous contrast.

[Series 7: sag bone · sagittal · 0.38mm/px · 5 of 85 slices shown, 6 images]
[im 29/85  bone]
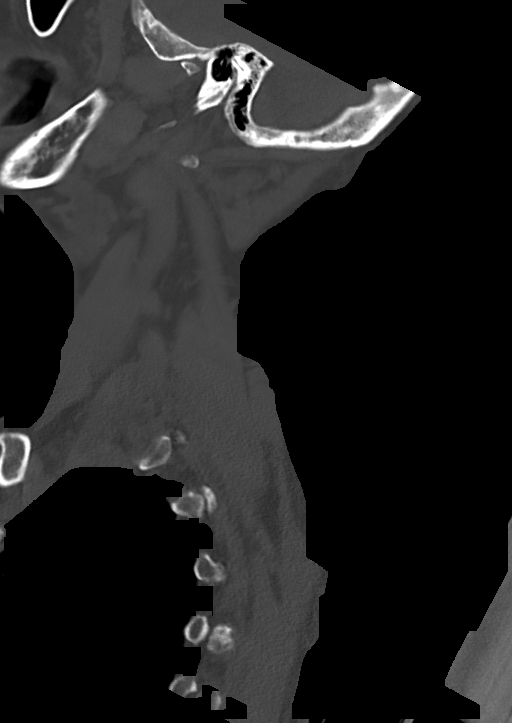
[im 36/85  bone]
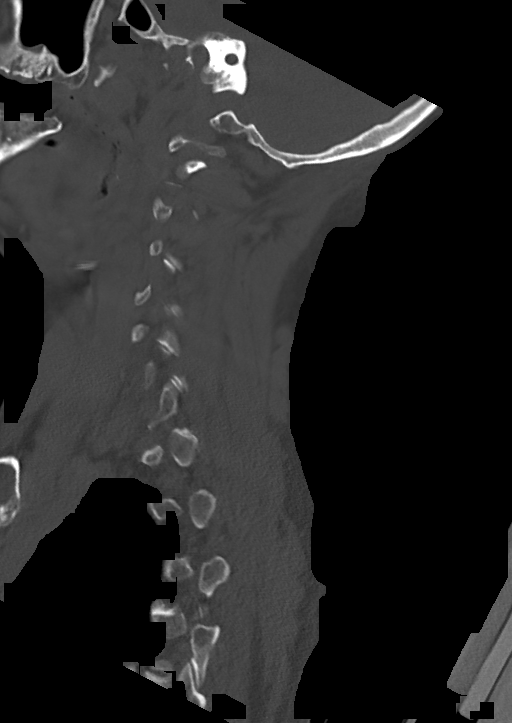
[im 43/85  soft-tissue]
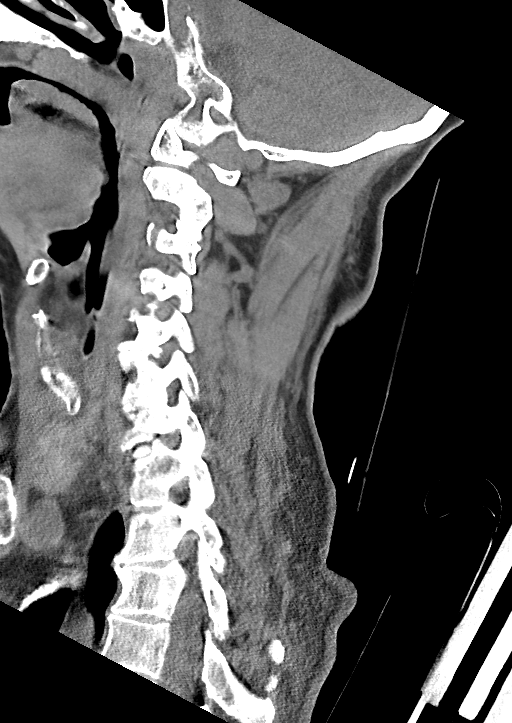
[im 43/85  bone]
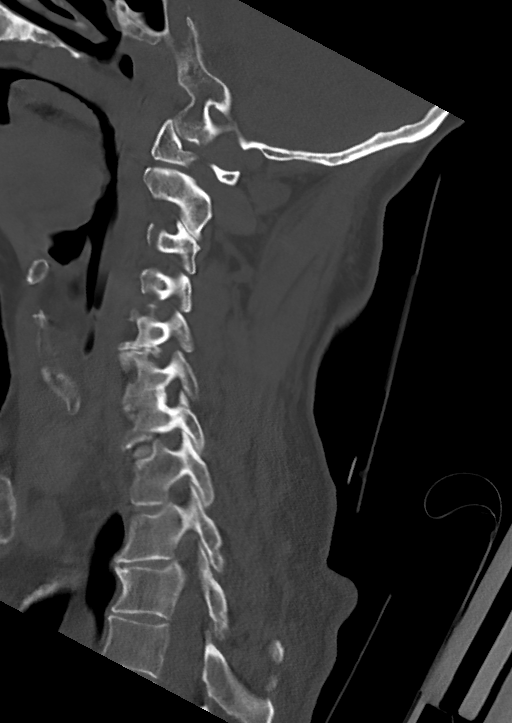
[im 50/85  bone]
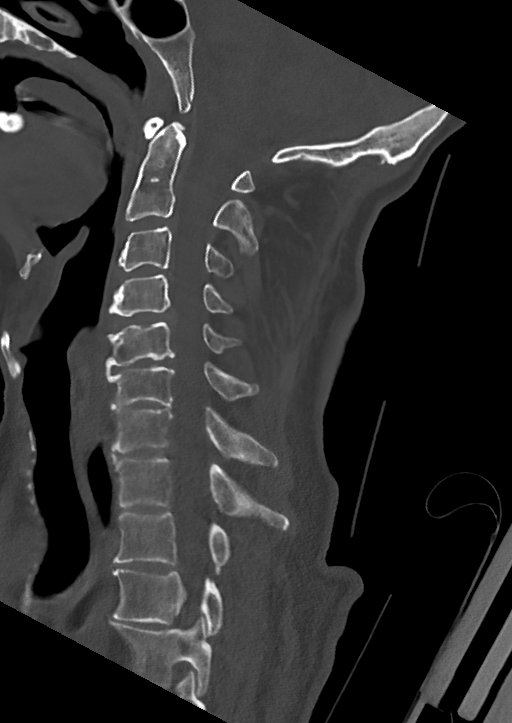
[im 57/85  bone]
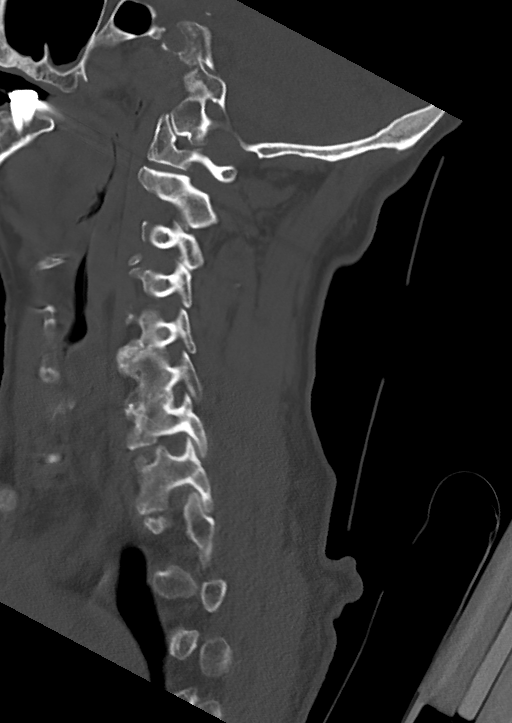

[Series 8: cor bone · coronal · 0.32mm/px · 3 of 90 slices shown]
[im 28/90  bone]
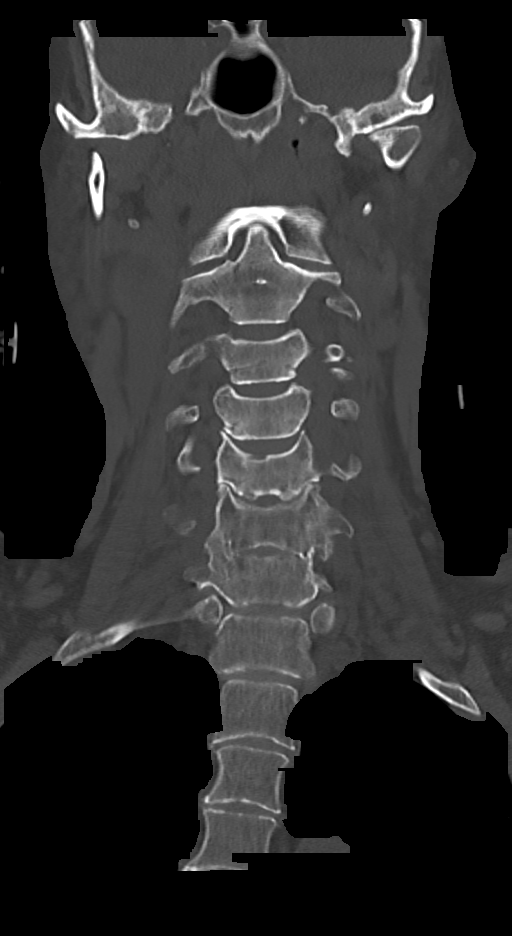
[im 40/90  bone]
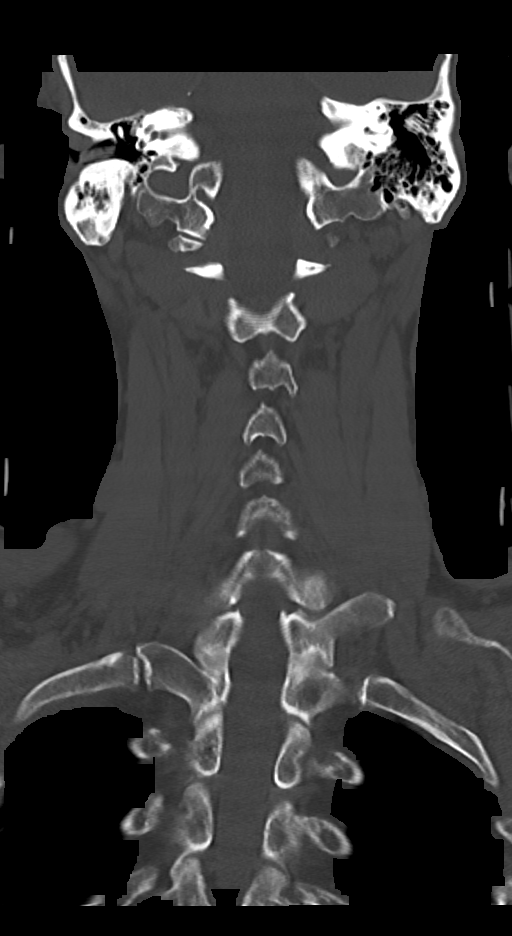
[im 51/90  bone]
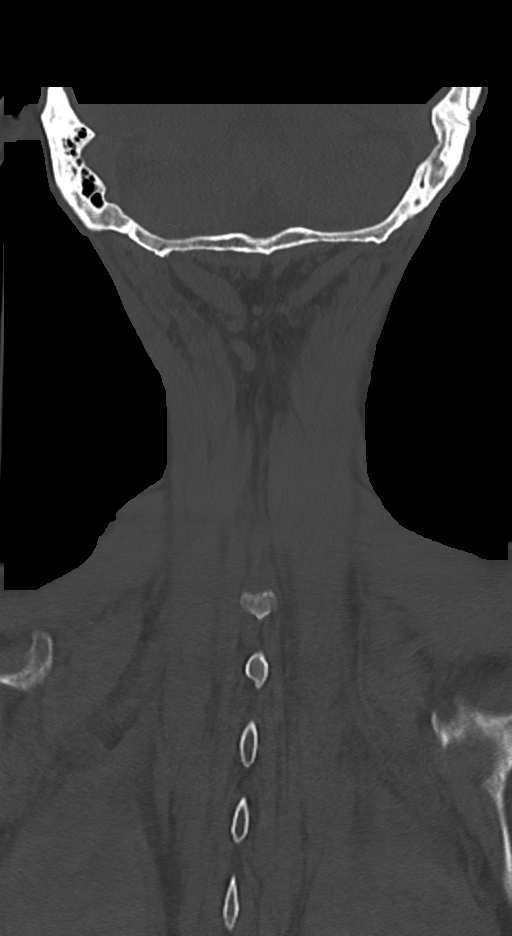

[Series 10: orthogonal axials · axial · 0.21mm/px · z∈[-315,-190]mm · 4 of 115 slices shown, 5 images]
[im 20/115  soft-tissue]
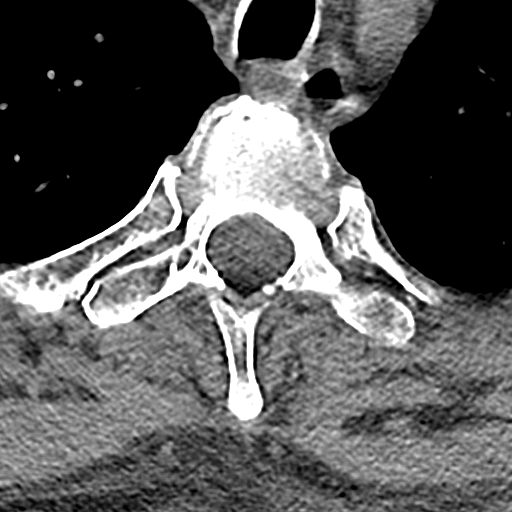
[im 20/115  bone]
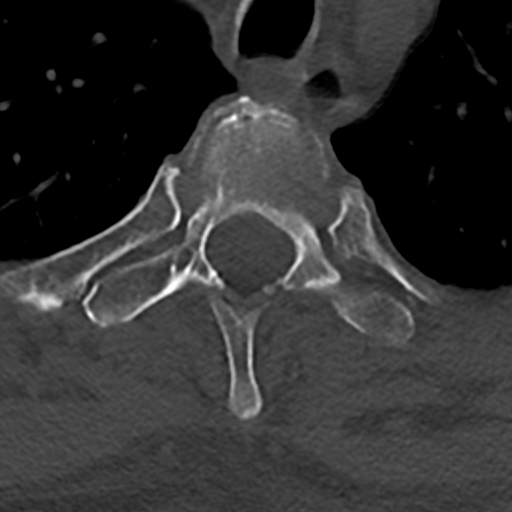
[im 39/115  bone]
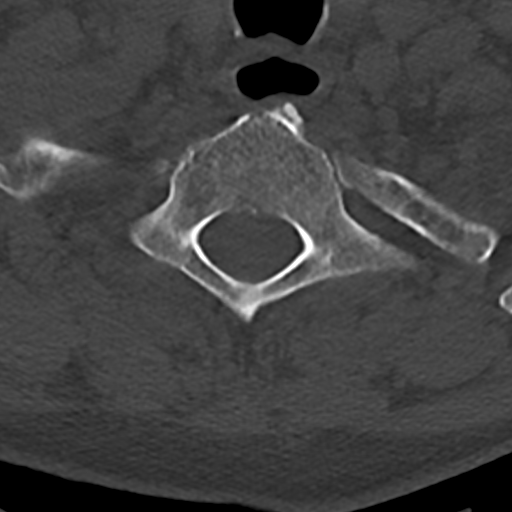
[im 77/115  bone]
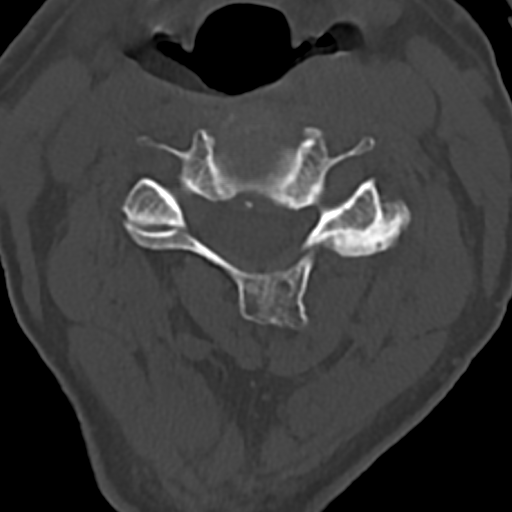
[im 96/115  bone]
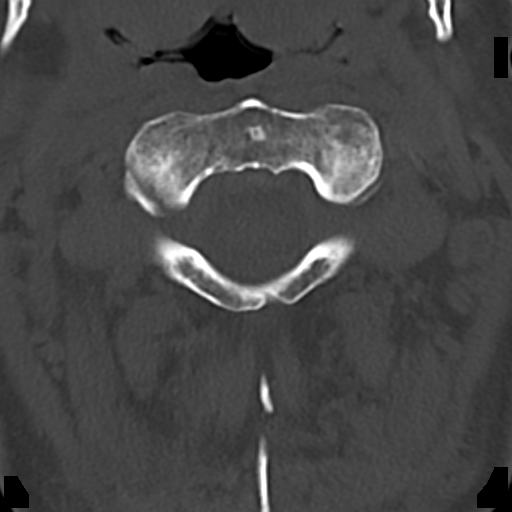

[12 of 33 positions shown; findings below may reference images not displayed]

FINDINGS: Brain: There is no mass, hemorrhage or extra-axial collection. There
is generalized atrophy without lobar predilection. Hypodensity of
the white matter is most commonly associated with chronic
microvascular disease.

Vascular: No abnormal hyperdensity of the major intracranial
arteries or dural venous sinuses. No intracranial atherosclerosis.

Skull: The visualized skull base, calvarium and extracranial soft
tissues are normal.

Sinuses/Orbits: No fluid levels or advanced mucosal thickening of
the visualized paranasal sinuses. No mastoid or middle ear effusion.
The orbits are normal.
IMPRESSION: Chronic microvascular ischemia and generalized atrophy without acute
intracranial abnormality.

ADDENDUM:
There is no acute fracture or static subluxation of the cervical
spine. There is mild degenerative disc disease within the mid
cervical spine without bony spinal canal stenosis. Alignment is
normal. Lung apices are clear.

*** End of Addendum ***
FINDINGS: Brain: There is no mass, hemorrhage or extra-axial collection. There
is generalized atrophy without lobar predilection. Hypodensity of
the white matter is most commonly associated with chronic
microvascular disease.

Vascular: No abnormal hyperdensity of the major intracranial
arteries or dural venous sinuses. No intracranial atherosclerosis.

Skull: The visualized skull base, calvarium and extracranial soft
tissues are normal.

Sinuses/Orbits: No fluid levels or advanced mucosal thickening of
the visualized paranasal sinuses. No mastoid or middle ear effusion.
The orbits are normal.
IMPRESSION: Chronic microvascular ischemia and generalized atrophy without acute
intracranial abnormality.

## 2022-12-14 IMAGING — CT CT HEAD W/O CM
4 series · 16 of 47 positions shown, 18 images · non-contrast
Comparison: None.
COMPARISON: None.

Addendum:
CLINICAL DATA: Head trauma

EXAM:
CT HEAD WITHOUT CONTRAST
TECHNIQUE: Contiguous axial images were obtained from the base of the skull
through the vertex without intravenous contrast.

[Series 3: head wo · axial · 0.43mm/px · z∈[-132,-12]mm · 7 of 33 slices shown, 9 images]
[im 5/33  brain]
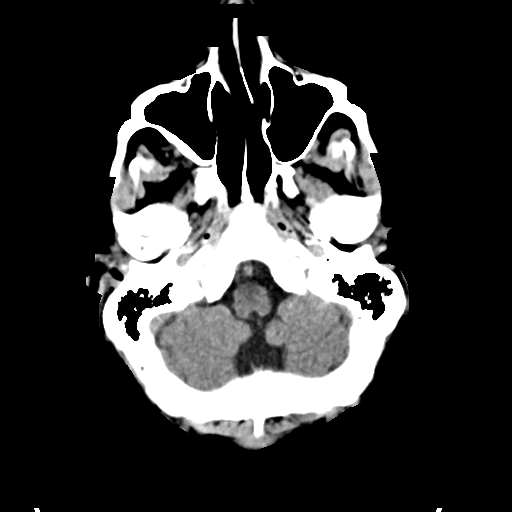
[im 5/33  bone]
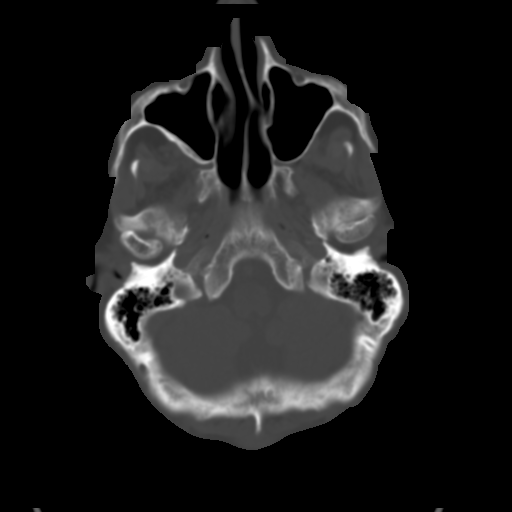
[im 9/33  brain]
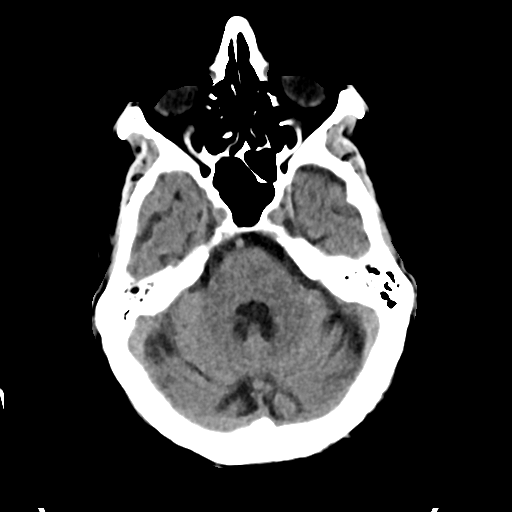
[im 13/33  brain]
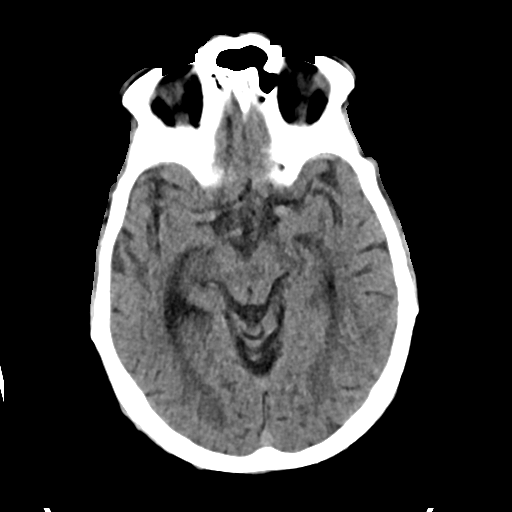
[im 17/33  brain]
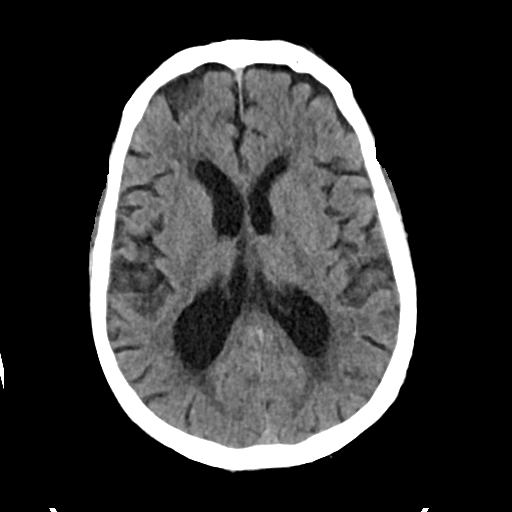
[im 21/33  brain]
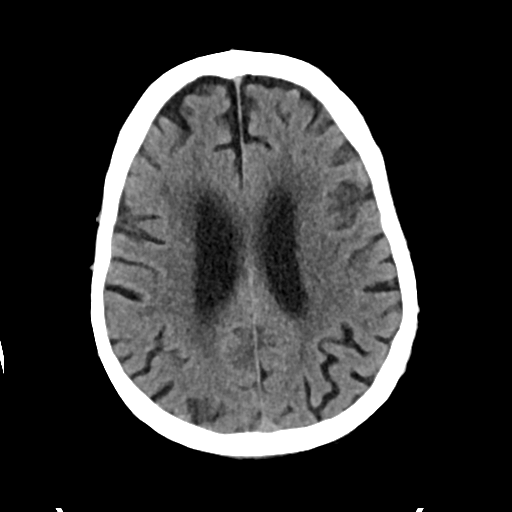
[im 21/33  bone]
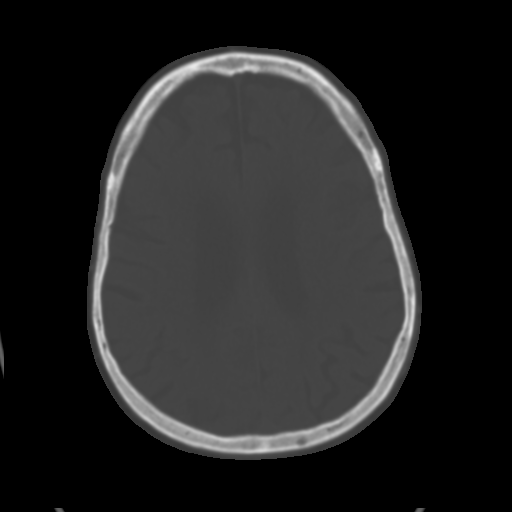
[im 25/33  brain]
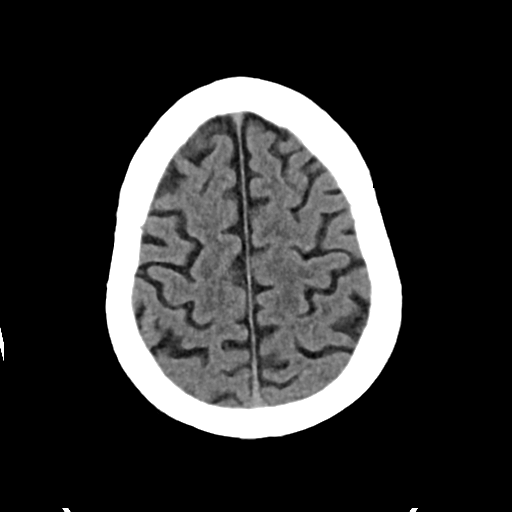
[im 29/33  brain]
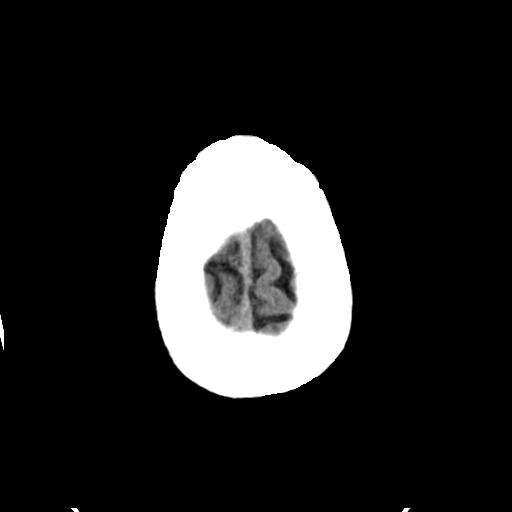

[Series 4: head bone · axial · 0.43mm/px · z∈[-136,-104]mm · 3 of 81 slices shown]
[im 9/81  bone]
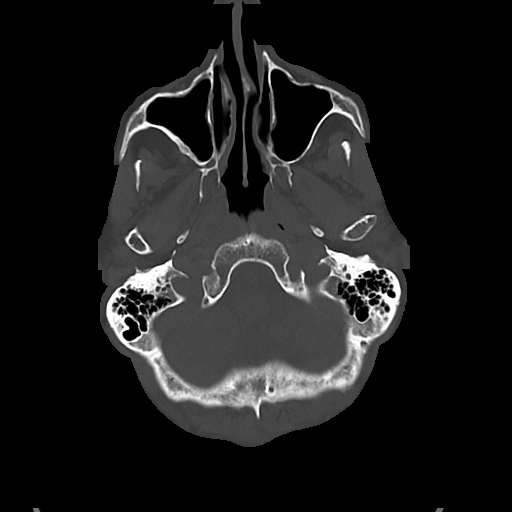
[im 17/81  bone]
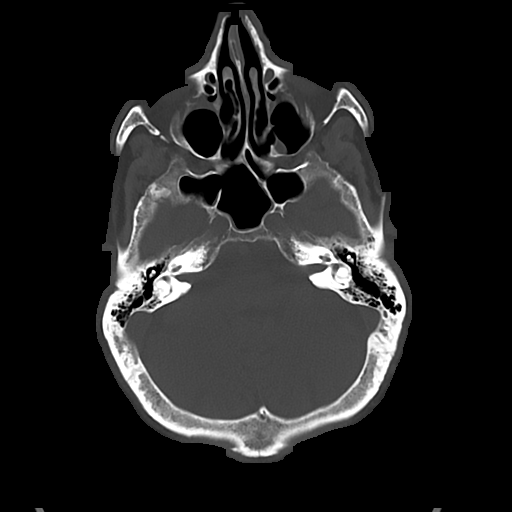
[im 25/81  bone]
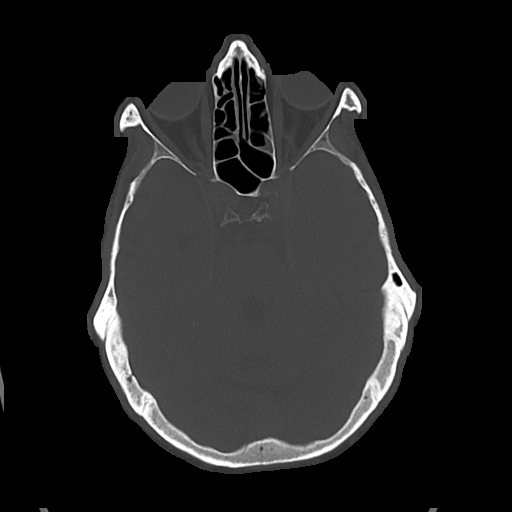

[Series 5: cor soft · coronal · 0.33mm/px · 3 of 71 slices shown]
[im 27/71  brain]
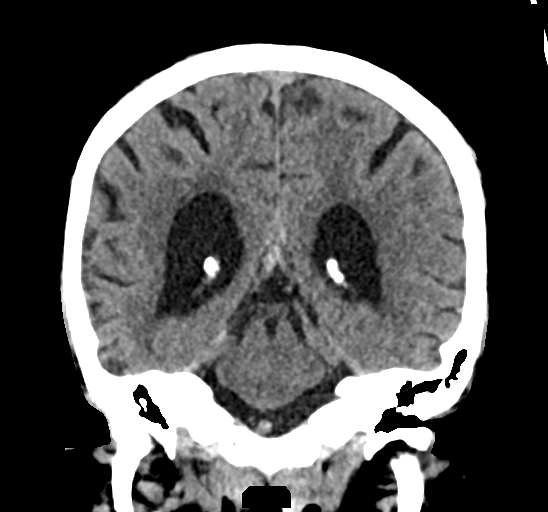
[im 33/71  brain]
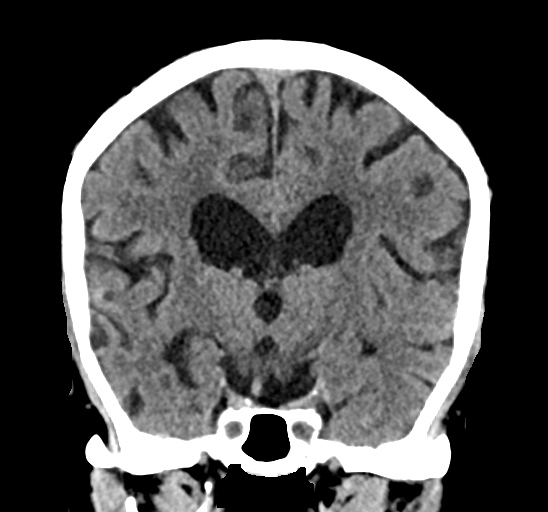
[im 38/71  brain]
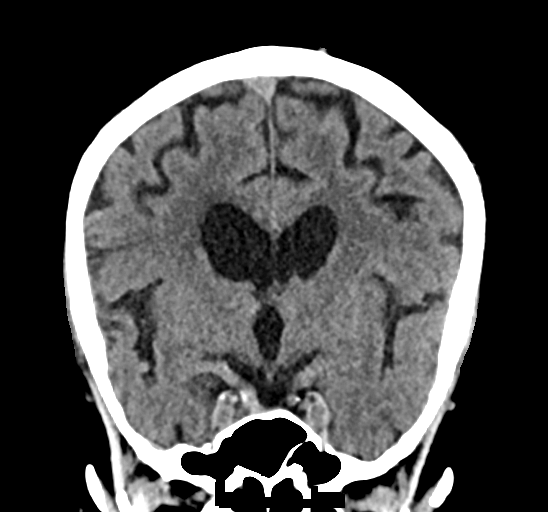

[Series 6: sag soft · sagittal · 0.33mm/px · 3 of 61 slices shown]
[im 21/61  brain]
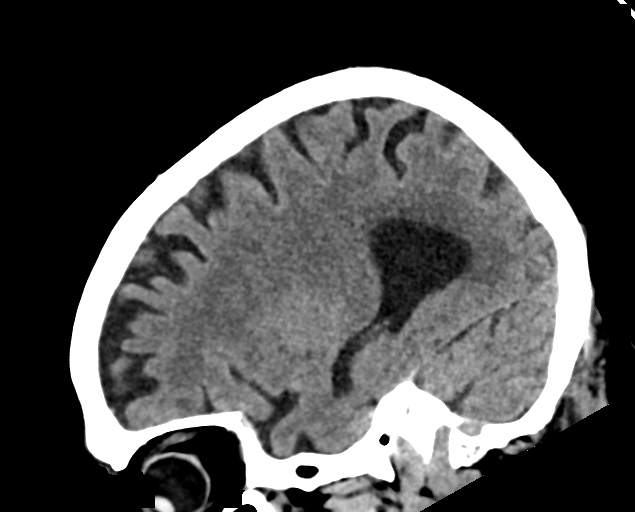
[im 31/61  brain]
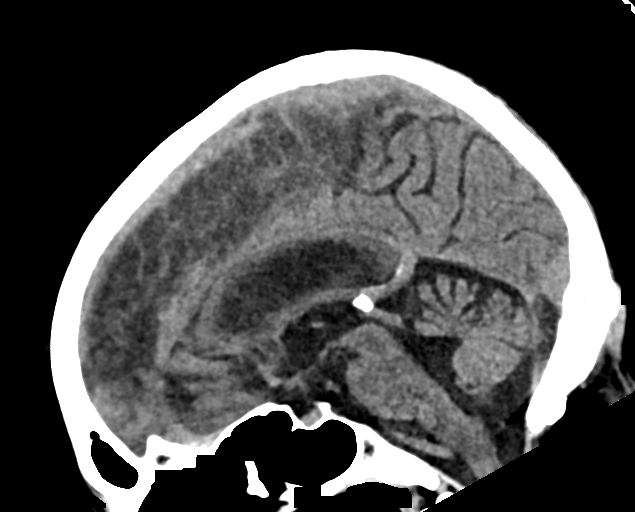
[im 41/61  brain]
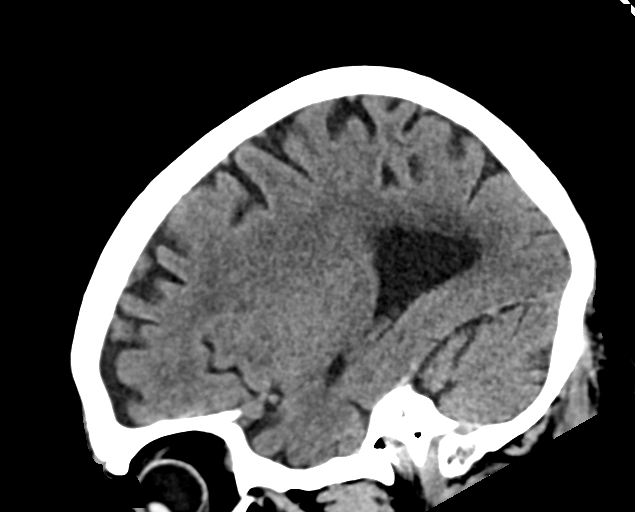

[16 of 47 positions shown; findings below may reference images not displayed]

FINDINGS: Brain: There is no mass, hemorrhage or extra-axial collection. There
is generalized atrophy without lobar predilection. Hypodensity of
the white matter is most commonly associated with chronic
microvascular disease.

Vascular: No abnormal hyperdensity of the major intracranial
arteries or dural venous sinuses. No intracranial atherosclerosis.

Skull: The visualized skull base, calvarium and extracranial soft
tissues are normal.

Sinuses/Orbits: No fluid levels or advanced mucosal thickening of
the visualized paranasal sinuses. No mastoid or middle ear effusion.
The orbits are normal.
IMPRESSION: Chronic microvascular ischemia and generalized atrophy without acute
intracranial abnormality.

ADDENDUM:
There is no acute fracture or static subluxation of the cervical
spine. There is mild degenerative disc disease within the mid
cervical spine without bony spinal canal stenosis. Alignment is
normal. Lung apices are clear.

*** End of Addendum ***
FINDINGS: Brain: There is no mass, hemorrhage or extra-axial collection. There
is generalized atrophy without lobar predilection. Hypodensity of
the white matter is most commonly associated with chronic
microvascular disease.

Vascular: No abnormal hyperdensity of the major intracranial
arteries or dural venous sinuses. No intracranial atherosclerosis.

Skull: The visualized skull base, calvarium and extracranial soft
tissues are normal.

Sinuses/Orbits: No fluid levels or advanced mucosal thickening of
the visualized paranasal sinuses. No mastoid or middle ear effusion.
The orbits are normal.
IMPRESSION: Chronic microvascular ischemia and generalized atrophy without acute
intracranial abnormality.

## 2022-12-16 ENCOUNTER — Ambulatory Visit: Payer: BC Managed Care – PPO | Admitting: Diagnostic Neuroimaging

## 2023-01-01 ENCOUNTER — Telehealth: Payer: Self-pay | Admitting: Diagnostic Neuroimaging

## 2023-01-01 NOTE — Telephone Encounter (Signed)
Patient no showed/canelled same day New patient appointments twice this year. Had an appt 10/23/22 at 2:00 he cancelled at 10:59AM. No showed appointment on 12/16/22.

## 2023-01-08 ENCOUNTER — Encounter: Payer: Self-pay | Admitting: Diagnostic Neuroimaging

## 2023-11-09 DIAGNOSIS — G8929 Other chronic pain: Secondary | ICD-10-CM | POA: Diagnosis not present

## 2023-11-09 DIAGNOSIS — R296 Repeated falls: Secondary | ICD-10-CM | POA: Diagnosis not present

## 2023-11-09 DIAGNOSIS — I700 Atherosclerosis of aorta: Secondary | ICD-10-CM | POA: Diagnosis not present

## 2023-11-09 DIAGNOSIS — I10 Essential (primary) hypertension: Secondary | ICD-10-CM | POA: Diagnosis not present

## 2023-11-09 DIAGNOSIS — M47812 Spondylosis without myelopathy or radiculopathy, cervical region: Secondary | ICD-10-CM | POA: Diagnosis not present

## 2023-11-09 DIAGNOSIS — H259 Unspecified age-related cataract: Secondary | ICD-10-CM | POA: Diagnosis not present

## 2023-11-09 DIAGNOSIS — J439 Emphysema, unspecified: Secondary | ICD-10-CM | POA: Diagnosis not present

## 2023-11-09 DIAGNOSIS — I2510 Atherosclerotic heart disease of native coronary artery without angina pectoris: Secondary | ICD-10-CM | POA: Diagnosis not present

## 2023-11-09 DIAGNOSIS — Z79899 Other long term (current) drug therapy: Secondary | ICD-10-CM | POA: Diagnosis not present
